# Patient Record
Sex: Female | Born: 1965 | Race: White | Hispanic: No | Smoking: Never smoker
Health system: Southern US, Community
[De-identification: ages and names within clinical notes are randomized; demographics above are authoritative.]

## PROBLEM LIST (undated history)

## (undated) DIAGNOSIS — S52502A Unspecified fracture of the lower end of left radius, initial encounter for closed fracture: Secondary | ICD-10-CM

## (undated) DIAGNOSIS — M941 Relapsing polychondritis: Secondary | ICD-10-CM

## (undated) DIAGNOSIS — R079 Chest pain, unspecified: Secondary | ICD-10-CM

## (undated) DIAGNOSIS — M8580 Other specified disorders of bone density and structure, unspecified site: Secondary | ICD-10-CM

## (undated) DIAGNOSIS — E669 Obesity, unspecified: Secondary | ICD-10-CM

## (undated) DIAGNOSIS — H04129 Dry eye syndrome of unspecified lacrimal gland: Secondary | ICD-10-CM

## (undated) DIAGNOSIS — K589 Irritable bowel syndrome without diarrhea: Secondary | ICD-10-CM

## (undated) DIAGNOSIS — F329 Major depressive disorder, single episode, unspecified: Secondary | ICD-10-CM

## (undated) DIAGNOSIS — F32A Depression, unspecified: Secondary | ICD-10-CM

## (undated) DIAGNOSIS — J301 Allergic rhinitis due to pollen: Secondary | ICD-10-CM

## (undated) HISTORY — DX: Depression, unspecified: F32.A

## (undated) HISTORY — DX: Dry eye syndrome of unspecified lacrimal gland: H04.129

## (undated) HISTORY — DX: Obesity, unspecified: E66.9

## (undated) HISTORY — DX: Other specified disorders of bone density and structure, unspecified site: M85.80

## (undated) HISTORY — DX: Chest pain, unspecified: R07.9

## (undated) HISTORY — DX: Irritable bowel syndrome, unspecified: K58.9

## (undated) HISTORY — DX: Allergic rhinitis due to pollen: J30.1

## (undated) HISTORY — DX: Major depressive disorder, single episode, unspecified: F32.9

---

## 2003-02-05 ENCOUNTER — Other Ambulatory Visit: Admission: RE | Admit: 2003-02-05 | Discharge: 2003-02-05 | Payer: Self-pay | Admitting: Family Medicine

## 2003-04-22 ENCOUNTER — Ambulatory Visit (HOSPITAL_COMMUNITY): Admission: RE | Admit: 2003-04-22 | Discharge: 2003-04-22 | Payer: Self-pay | Admitting: Family Medicine

## 2003-04-25 ENCOUNTER — Encounter (INDEPENDENT_AMBULATORY_CARE_PROVIDER_SITE_OTHER): Payer: Self-pay | Admitting: Specialist

## 2003-04-25 ENCOUNTER — Ambulatory Visit (HOSPITAL_COMMUNITY): Admission: RE | Admit: 2003-04-25 | Discharge: 2003-04-25 | Payer: Self-pay | Admitting: Gastroenterology

## 2003-04-28 ENCOUNTER — Emergency Department (HOSPITAL_COMMUNITY): Admission: EM | Admit: 2003-04-28 | Discharge: 2003-04-28 | Payer: Self-pay | Admitting: Emergency Medicine

## 2005-02-03 ENCOUNTER — Other Ambulatory Visit: Admission: RE | Admit: 2005-02-03 | Discharge: 2005-02-03 | Payer: Self-pay | Admitting: Family Medicine

## 2006-06-17 ENCOUNTER — Encounter: Admission: RE | Admit: 2006-06-17 | Discharge: 2006-06-17 | Payer: Self-pay | Admitting: Family Medicine

## 2006-07-19 ENCOUNTER — Ambulatory Visit: Payer: Self-pay | Admitting: Vascular Surgery

## 2006-08-04 ENCOUNTER — Encounter: Admission: RE | Admit: 2006-08-04 | Discharge: 2006-08-04 | Payer: Self-pay | Admitting: Orthopedic Surgery

## 2006-09-06 ENCOUNTER — Encounter: Admission: RE | Admit: 2006-09-06 | Discharge: 2006-10-06 | Payer: Self-pay | Admitting: Orthopedic Surgery

## 2006-10-24 ENCOUNTER — Other Ambulatory Visit: Admission: RE | Admit: 2006-10-24 | Discharge: 2006-10-24 | Payer: Self-pay | Admitting: Family Medicine

## 2007-04-26 ENCOUNTER — Encounter: Admission: RE | Admit: 2007-04-26 | Discharge: 2007-04-26 | Payer: Self-pay | Admitting: Internal Medicine

## 2009-03-18 ENCOUNTER — Emergency Department (HOSPITAL_COMMUNITY): Admission: EM | Admit: 2009-03-18 | Discharge: 2009-03-18 | Payer: Self-pay | Admitting: Emergency Medicine

## 2010-08-14 NOTE — Op Note (Signed)
NAME:  Fraiser, NOEL                           ACCOUNT NO.:  [id number]   MEDICAL RECORD NO.:  [id number]                   PATIENT TYPE:  AMB   LOCATION:  ENDO                                 FACILITY:  [facility]   PHYSICIAN:  Danise Edge, M.D.                DATE OF BIRTH:  [birth year]   DATE OF PROCEDURE:  04/25/2003  DATE OF DISCHARGE:                                 OPERATIVE REPORT   PROCEDURE:  Diagnostic colonoscopy.   PROCEDURE INDICATION:  Ms. Dan Dissinger is a 45 year old female, born Feb 10, 1966.  For approximately 1 year, Ms. Mehlhoff has had predominantly  morning-time functional-type, watery diarrhea occasionally associated with  the passage of fresh blood.  Usually by midmorning, her diarrhea has run its  course.  Recently, she has had bowel urgency with diarrhea extending into  the early afternoon.  She reports no nocturnal diarrhea, weight loss, or  fever.  Food intake does not seem to precipitate or worsen her diarrhea.  There is no family history of inflammatory bowel disease.  Approximately a  year ago, Ms.  Finan husband left her and her children abruptly.  This  is about the time her diarrhea started, and she thinks it is more stress  related than disease related.   MEDICATION ALLERGIES:  None.   CHRONIC MEDICATIONS:  Vicodin, Imodium, ibuprofen.   PAST MEDICAL/SURGICAL HISTORY:  Negative.   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  Her 33-year-old and 84-year-old daughter are in excellent health.   Ms. Stjames is undergoing diagnostic colonoscopy to evaluate diarrhea with  hematochezia.  Differential diagnosis would include inflammatory bowel  disease, microscopic colitis, and irritable bowel syndrome.   ENDOSCOPIST:  Charolett Bumpers, M.D.   PREMEDICATION:  1. Versed 10 mg.  2. Demerol 100 mg.   DESCRIPTION OF PROCEDURE:  After obtaining informed consent, Ms. Yinger  was placed in the left lateral  decubitus position.  I administered  intravenous Demerol and intravenous Versed to achieve conscious sedation for  the procedure.  The patient's blood pressure, oxygen saturation, and cardiac  rhythm were monitored throughout the procedure and documented in the medical  record.   Anal inspection was normal.  Digital rectal exam was normal.  The Olympus  adjustable pediatric colonoscope was introduced into the rectum and advanced  to the cecum.  A normal-appearing ileocecal valve was intubated and the  distal ileum inspected.  Colonic preparation for the exam today was  excellent.   RECTUM:  From the proximal rectum, a 1 mm sessile polyp was removed with the  cold biopsy forceps.  SIGMOID COLON AND DESCENDING COLON:  Normal.  SPLENIC FLEXURE:  Normal.  TRANSVERSE COLON:  Normal.  HEPATIC FLEXURE:  Normal.  ASCENDING COLON:  Normal.  CECUM AND ILEOCECAL VALVE:  Normal.  DISTAL ILEUM:  Normal.   ASSESSMENT:  Normal proctocolonoscopy to the cecum  with inspection of the  distal ileum except for the removal of a diminutive polyp from the proximal  rectum which is probably hyperplastic.  Random colonic biopsies were taken  from the right colon and left colon, looking for signs of microscopic -  collagenous colitis.                                               Danise Edge, M.D.    MJ/MEDQ  D:  04/25/2003  T:  04/25/2003  Job:  [id number]   cc:   L. Lupe Carney, M.D.  [address] [place]  [place] [address]  Fax: [phone]

## 2011-01-08 ENCOUNTER — Other Ambulatory Visit (HOSPITAL_COMMUNITY): Admission: RE | Admit: 2011-01-08 | Payer: Medicaid Other | Source: Ambulatory Visit | Admitting: Family Medicine

## 2011-01-08 ENCOUNTER — Other Ambulatory Visit: Payer: Self-pay | Admitting: Physician Assistant

## 2011-01-08 DIAGNOSIS — Z01419 Encounter for gynecological examination (general) (routine) without abnormal findings: Secondary | ICD-10-CM | POA: Insufficient documentation

## 2011-01-11 ENCOUNTER — Other Ambulatory Visit: Payer: Self-pay | Admitting: Physician Assistant

## 2013-01-09 ENCOUNTER — Encounter (HOSPITAL_COMMUNITY): Payer: Self-pay | Admitting: Emergency Medicine

## 2013-01-09 ENCOUNTER — Emergency Department (HOSPITAL_COMMUNITY)
Admission: EM | Admit: 2013-01-09 | Discharge: 2013-01-09 | Disposition: A | Discharge status: Home / Self Care | Payer: Medicaid Other | Attending: Emergency Medicine | Admitting: Emergency Medicine

## 2013-01-09 ENCOUNTER — Emergency Department (HOSPITAL_COMMUNITY): Payer: Medicaid Other

## 2013-01-09 DIAGNOSIS — Z87312 Personal history of (healed) stress fracture: Secondary | ICD-10-CM | POA: Insufficient documentation

## 2013-01-09 DIAGNOSIS — Y939 Activity, unspecified: Secondary | ICD-10-CM | POA: Insufficient documentation

## 2013-01-09 DIAGNOSIS — S8253XA Displaced fracture of medial malleolus of unspecified tibia, initial encounter for closed fracture: Secondary | ICD-10-CM | POA: Insufficient documentation

## 2013-01-09 DIAGNOSIS — R209 Unspecified disturbances of skin sensation: Secondary | ICD-10-CM | POA: Insufficient documentation

## 2013-01-09 DIAGNOSIS — M948X9 Other specified disorders of cartilage, unspecified sites: Secondary | ICD-10-CM | POA: Insufficient documentation

## 2013-01-09 DIAGNOSIS — Y929 Unspecified place or not applicable: Secondary | ICD-10-CM | POA: Insufficient documentation

## 2013-01-09 DIAGNOSIS — X58XXXA Exposure to other specified factors, initial encounter: Secondary | ICD-10-CM | POA: Insufficient documentation

## 2013-01-09 DIAGNOSIS — S82891A Other fracture of right lower leg, initial encounter for closed fracture: Secondary | ICD-10-CM

## 2013-01-09 HISTORY — DX: Relapsing polychondritis: M94.1

## 2013-01-09 MED ORDER — HYDROCODONE-ACETAMINOPHEN 5-325 MG PO TABS: 1.0000 | ORAL_TABLET | Freq: Four times a day (QID) | ORAL | Status: DC | PRN

## 2013-01-09 NOTE — ED Notes (Signed)
Pt reports that right ankle pain that started a couple of days ago. Reports that she has a hx of stress fractures. Pt with a swollen ankle at triage. 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

## 2013-01-09 NOTE — ED Provider Notes (Signed)
CSN: [id number]     Arrival date & time 01/09/13  1130 History   First MD Initiated Contact with Patient 01/09/13 1142     Chief Complaint  Patient presents with  . Ankle Pain   (Consider location/radiation/quality/duration/timing/severity/associated sxs/prior Treatment) HPI Patient presents to ED with right ankle pain for 4 days. She has a history of relapsing polychondritis, and multiple stress fractures. She reports that this pain is similar to past stress fractures. The pain began 4 days ago, and has worsened until today when the pain was 10/10 in severity. It is sharp, worsening with movement and bearing weight. It radiates up the leg from the ankle into the calf, and keeps her up at night. The pain is more severe medially than laterally, but she reports some numbness in the lateral foot and toes. Motrin, ice, compression, and elevation provided no relief, and one hydrocodone provided little relief. She denies fever.   Past Medical History  Diagnosis Date  . Relapsing polychondritis    History reviewed. No pertinent past surgical history. History reviewed. No pertinent family history. History  Substance Use Topics  . Smoking status: Never Smoker   . Smokeless tobacco: Not on file  . Alcohol Use: No   OB History   Grav Para Term Preterm Abortions TAB SAB Ect Mult Living                 Review of Systems All other systems negative except as documented in the HPI. All pertinent positives and negatives as reviewed in the HPI.  Allergies  Review of patient's allergies indicates no known allergies.  Home Medications   Current Outpatient Rx  Name  Route  Sig  Dispense  Refill  . HYDROcodone-acetaminophen (NORCO/VICODIN) 5-325 MG per tablet   Oral   Take 1 tablet by mouth every 6 (six) hours as needed for pain.          BP 144/74  Pulse 90  Temp(Src) 97.8 F (36.6 C) (Oral)  Resp 18  SpO2 100%  LMP 12/19/2012 Physical Exam  Constitutional: She is oriented to person,  place, and time. She appears well-developed and well-nourished. No distress.  Musculoskeletal:       Right ankle: She exhibits decreased range of motion and swelling. She exhibits no ecchymosis and no deformity. Tenderness. Lateral malleolus, medial malleolus and AITFL tenderness found. No CF ligament, no posterior TFL, no head of 5th metatarsal and no proximal fibula tenderness found. Achilles tendon normal.  Decreased ROM of right ankle.  Neurological: She is alert and oriented to person, place, and time. No sensory deficit.  No sensory deficit in right ankle, but has decreased strength throughout ROM of ankle.  Skin: Skin is warm and dry. She is not diaphoretic. No erythema.    ED Course  Procedures (including critical care time) Patient be placed in a Cam Walker and referred to orthopedics.  Patient voices an understanding of the plan.  She has a history of this and understands the expected course    Carlyle Dolly, PA-C 01/10/13 1559

## 2013-01-09 NOTE — ED Notes (Signed)
Pt has her own cam walker.

## 2013-01-10 NOTE — ED Provider Notes (Signed)
Medical screening examination/treatment/procedure(s) were performed by non-physician practitioner and as supervising physician I was immediately available for consultation/collaboration.   Laray Anger, DO 01/10/13 2204

## 2013-02-15 ENCOUNTER — Emergency Department (HOSPITAL_COMMUNITY): Payer: Self-pay

## 2013-02-15 ENCOUNTER — Encounter (HOSPITAL_COMMUNITY): Payer: Self-pay | Admitting: Emergency Medicine

## 2013-02-15 ENCOUNTER — Emergency Department (HOSPITAL_COMMUNITY)
Admission: EM | Admit: 2013-02-15 | Discharge: 2013-02-15 | Disposition: A | Discharge status: Home / Self Care | Payer: Self-pay | Attending: Emergency Medicine | Admitting: Emergency Medicine

## 2013-02-15 DIAGNOSIS — R079 Chest pain, unspecified: Secondary | ICD-10-CM

## 2013-02-15 DIAGNOSIS — M25579 Pain in unspecified ankle and joints of unspecified foot: Secondary | ICD-10-CM | POA: Insufficient documentation

## 2013-02-15 DIAGNOSIS — R071 Chest pain on breathing: Secondary | ICD-10-CM | POA: Insufficient documentation

## 2013-02-15 DIAGNOSIS — G8911 Acute pain due to trauma: Secondary | ICD-10-CM | POA: Insufficient documentation

## 2013-02-15 DIAGNOSIS — R05 Cough: Secondary | ICD-10-CM | POA: Insufficient documentation

## 2013-02-15 DIAGNOSIS — R059 Cough, unspecified: Secondary | ICD-10-CM | POA: Insufficient documentation

## 2013-02-15 DIAGNOSIS — R0602 Shortness of breath: Secondary | ICD-10-CM | POA: Insufficient documentation

## 2013-02-15 LAB — POCT I-STAT TROPONIN I: Troponin i, poc: 0 ng/mL (ref 0.00–0.08)

## 2013-02-15 LAB — BASIC METABOLIC PANEL
BUN: 10 mg/dL (ref 6–23)
CO2: 25 mEq/L (ref 19–32)
Chloride: 101 mEq/L (ref 96–112)
Creatinine, Ser: 0.68 mg/dL (ref 0.50–1.10)
GFR calc Af Amer: >90 mL/min (ref >90)
Potassium: 3.5 mEq/L (ref 3.5–5.1)

## 2013-02-15 LAB — CBC
HCT: 33.6 % — ABNORMAL LOW (ref 36.0–46.0)
Hemoglobin: 11.2 g/dL — ABNORMAL LOW (ref 12.0–15.0)
MCHC: 33.3 g/dL (ref 30.0–36.0)
MCV: 82.2 fL (ref 78.0–100.0)
RDW: 13.6 % (ref 11.5–15.5)
WBC: 8.4 10*3/uL (ref 4.0–10.5)

## 2013-02-15 MED ORDER — ONDANSETRON 4 MG PO TBDP
8.0000 mg | ORAL_TABLET | Freq: Once | ORAL | Status: AC
  Administered 2013-02-15: 8 mg via ORAL
  Filled 2013-02-15: qty 2

## 2013-02-15 MED ORDER — PREDNISONE 20 MG PO TABS: 40.0000 mg | ORAL_TABLET | Freq: Every day | ORAL | Status: DC

## 2013-02-15 MED ORDER — ONDANSETRON HCL 4 MG/2ML IJ SOLN
4.0000 mg | Freq: Once | INTRAMUSCULAR | Status: AC
  Administered 2013-02-15: 4 mg via INTRAVENOUS
  Filled 2013-02-15: qty 2

## 2013-02-15 MED ORDER — OXYCODONE-ACETAMINOPHEN 5-325 MG PO TABS: 2.0000 | ORAL_TABLET | ORAL | Status: DC | PRN

## 2013-02-15 MED ORDER — MORPHINE SULFATE 4 MG/ML IJ SOLN
4.0000 mg | Freq: Once | INTRAMUSCULAR | Status: AC
  Administered 2013-02-15: 4 mg via INTRAVENOUS
  Filled 2013-02-15: qty 1

## 2013-02-15 MED ORDER — HYDROMORPHONE HCL PF 1 MG/ML IJ SOLN
1.0000 mg | Freq: Once | INTRAMUSCULAR | Status: AC
  Administered 2013-02-15: 1 mg via INTRAVENOUS
  Filled 2013-02-15: qty 1

## 2013-02-15 MED ORDER — ALBUTEROL SULFATE (5 MG/ML) 0.5% IN NEBU
2.5000 mg | INHALATION_SOLUTION | Freq: Once | RESPIRATORY_TRACT | Status: AC
  Administered 2013-02-15: 2.5 mg via RESPIRATORY_TRACT
  Filled 2013-02-15: qty 0.5

## 2013-02-15 MED ORDER — PROMETHAZINE HCL 25 MG PO TABS: 25.0000 mg | ORAL_TABLET | Freq: Four times a day (QID) | ORAL | Status: DC | PRN

## 2013-02-15 MED ORDER — IPRATROPIUM BROMIDE 0.02 % IN SOLN
0.5000 mg | Freq: Once | RESPIRATORY_TRACT | Status: AC
  Administered 2013-02-15: 0.5 mg via RESPIRATORY_TRACT
  Filled 2013-02-15: qty 2.5

## 2013-02-15 MED ORDER — HYDROCODONE-HOMATROPINE 5-1.5 MG/5ML PO SYRP: 5.0000 mL | ORAL_SOLUTION | Freq: Four times a day (QID) | ORAL | Status: DC | PRN

## 2013-02-15 MED ORDER — IOHEXOL 350 MG/ML SOLN
80.0000 mL | Freq: Once | INTRAVENOUS | Status: AC | PRN
  Administered 2013-02-15: 80 mL via INTRAVENOUS

## 2013-02-15 NOTE — ED Notes (Signed)
Pt reports hx of relapsing polychrondiritis. Having sob x 24 hours. ekg done at triage. Airway intact.

## 2013-02-15 NOTE — ED Provider Notes (Signed)
CSN: [id number]     Arrival date & time 02/15/13  1135 History   First MD Initiated Contact with Patient 02/15/13 1149     Chief Complaint  Patient presents with  . Shortness of Breath   (Consider location/radiation/quality/duration/timing/severity/associated sxs/prior Treatment) HPI Comments: Patient is a 47 year old female with a past medical history of relapsing polychondritis who presents with a 1 week history of SOB. Symptoms started gradually and progressively worsened since the onset. The symptoms acutely worsened in the past 2 days. Patient reports associated dry cough and right sides chest pain. The pain is sharp and pleuritic. Patient has been wearing a CAM walker on her right leg due to an ankle fracture for the past 5 weeks. Patient states this feels exactly like her polychondritis flares. No aggravating/alleviating factors. No associated symptoms. Patient explains she has been unable to see her regular doctor since she lost her job and no longer has her insurance.    Past Medical History  Diagnosis Date  . Relapsing polychondritis    History reviewed. No pertinent past surgical history. History reviewed. No pertinent family history. History  Substance Use Topics  . Smoking status: Never Smoker   . Smokeless tobacco: Not on file  . Alcohol Use: No   OB History   Grav Para Term Preterm Abortions TAB SAB Ect Mult Living                 Review of Systems  Respiratory: Positive for cough and shortness of breath.   Cardiovascular: Positive for chest pain.  All other systems reviewed and are negative.    Allergies  Review of patient's allergies indicates no known allergies.  Home Medications   Current Outpatient Rx  Name  Route  Sig  Dispense  Refill  . HYDROcodone-acetaminophen (NORCO/VICODIN) 5-325 MG per tablet   Oral   Take 1 tablet by mouth every 6 (six) hours as needed for pain.         Marland Kitchen HYDROcodone-acetaminophen (NORCO/VICODIN) 5-325 MG per tablet  Oral   Take 1 tablet by mouth every 6 (six) hours as needed for pain.   15 tablet   0    BP 143/74  Pulse 113  Temp(Src) 97.9 F (36.6 C) (Oral)  Resp 18  SpO2 100% Physical Exam  Nursing note and vitals reviewed. Constitutional: She is oriented to person, place, and time. She appears well-developed and well-nourished. No distress.  Patient coughing throughout interview and exam.   HENT:  Head: Normocephalic and atraumatic.  Eyes: Conjunctivae and EOM are normal.  Neck: Normal range of motion.  Cardiovascular: Regular rhythm.  Exam reveals no gallop and no friction rub.   No murmur heard. tachycardic  Pulmonary/Chest: Effort normal and breath sounds normal. She has no wheezes. She has no rales. She exhibits tenderness.  Central chest tenderness to palpation.   Abdominal: Soft. She exhibits no distension. There is no tenderness. There is no rebound and no guarding.  Musculoskeletal: Normal range of motion.  No calf or popliteal tenderness to palpation. No lower leg edema. ACE bandages on bilateral ankles. CAM walker on right lower leg.   Neurological: She is alert and oriented to person, place, and time. Coordination normal.  Speech is goal-oriented. Moves limbs without ataxia.   Skin: Skin is warm and dry.  Psychiatric: She has a normal mood and affect. Her behavior is normal.    ED Course  Procedures (including critical care time) Labs Review Labs Reviewed  BASIC METABOLIC PANEL -  Abnormal; Notable for the following:    Glucose, Bld 106 (*)    All other components within normal limits  CBC - Abnormal; Notable for the following:    Hemoglobin 11.2 (*)    HCT 33.6 (*)    All other components within normal limits  D-DIMER, QUANTITATIVE - Abnormal; Notable for the following:    D-Dimer, Quant 2.62 (*)    All other components within normal limits  PRO B NATRIURETIC PEPTIDE  POCT I-STAT TROPONIN I   Imaging Review Dg Chest 2 View  02/15/2013   CLINICAL DATA:   Shortness of breath.  Cough.  EXAM: CHEST  2 VIEW  COMPARISON:  None.  FINDINGS: Mediastinum and hilar structures are unremarkable. Mild cardiomegaly and pulmonary venous prominence noted. Mild atelectasis versus infiltrate right lung base. Followup PA and lateral chest x-ray suggested for continued evaluation. No acute bony abnormality identified. Degenerative changes thoracic spine.  IMPRESSION: 1. Mild infiltrate right lower lobe cannot be excluded. 2. Cardiomegaly with mild pulmonary venous congestion. Followup chest x-ray may prove helpful for continued evaluation.   Electronically Signed   By: Maisie Fus  Register   On: 02/15/2013 13:06    EKG Interpretation    Date/Time:  Thursday February 15 2013 11:39:35 EST Ventricular Rate:  124 PR Interval:  138 QRS Duration: 76 QT Interval:  320 QTC Calculation: 459 R Axis:   74 Text Interpretation:  Sinus tachycardia Otherwise normal ECG No old tracing to compare Confirmed by GOLDSTON  MD, SCOTT ([id number]) on 02/15/2013 11:57:58 AM            MDM   1. Chest pain   2. SOB (shortness of breath)   3. Ankle pain, unspecified laterality     11:49 AM Labs, chest xray and EKG pending. Patient is tachycardic with remaining vitals stable.   4:13 PM Labs unremarkable. Chest xray shows right lower infiltrate. Patient's d-dimer is 2.62. Patient will have CT chest to rule out PE. Patient continues to be tachycardic and SOB. Patient is also requesting bilateral ankle films due to worsening ankle pain. She has no PCP so I agreed to order them.   7:32 PM D-dimer was elevate warranting a chest CT angio which was negative for PE. Patient will have pain medication and instructions to follow up with a PCP from the resource guide. Patient will speak with case management for prescription assistance.   7:49 PM Case manager states they provide no assistance with narcatics and prednisone is inexpensive. Patient will be discharged with instructions to return with  worsening or concerning symptoms.   Emilia Beck, PA-C 02/15/13 2040

## 2013-02-15 NOTE — ED Notes (Signed)
Patient returned from X-ray 

## 2013-02-15 NOTE — Progress Notes (Signed)
ED CM received call from [facility] PA-C.in regards to medication assistance. Pt is unemployed and unisured.  Pt encouraged to use [facility] $4 drug list. Discussed the MATCH criteria with K. Szekalski PA-C. Plan Discharge patient with prescriptions to fill from $4 list from [facility]. No further CM needs identified.

## 2013-02-15 NOTE — ED Notes (Signed)
Patient transported to X-ray 

## 2013-02-18 NOTE — ED Provider Notes (Signed)
Medical screening examination/treatment/procedure(s) were performed by non-physician practitioner and as supervising physician I was immediately available for consultation/collaboration.  EKG Interpretation    Date/Time:  Thursday February 15 2013 11:39:35 EST Ventricular Rate:  124 PR Interval:  138 QRS Duration: 76 QT Interval:  320 QTC Calculation: 459 R Axis:   74 Text Interpretation:  Sinus tachycardia Otherwise normal ECG No old tracing to compare Confirmed by Latiesha Harada  MD, Oluwatobiloba Martin ([id number]) on 02/15/2013 11:57:58 AM              Audree Camel, MD 02/18/13 1511

## 2013-03-03 ENCOUNTER — Encounter (HOSPITAL_COMMUNITY): Payer: Self-pay | Admitting: Emergency Medicine

## 2013-03-03 ENCOUNTER — Emergency Department (HOSPITAL_COMMUNITY): Payer: Medicaid Other

## 2013-03-03 ENCOUNTER — Emergency Department (HOSPITAL_COMMUNITY)
Admission: EM | Admit: 2013-03-03 | Discharge: 2013-03-03 | Disposition: A | Discharge status: Home / Self Care | Payer: Medicaid Other | Attending: Emergency Medicine | Admitting: Emergency Medicine

## 2013-03-03 DIAGNOSIS — Z79899 Other long term (current) drug therapy: Secondary | ICD-10-CM | POA: Insufficient documentation

## 2013-03-03 DIAGNOSIS — M25572 Pain in left ankle and joints of left foot: Secondary | ICD-10-CM

## 2013-03-03 DIAGNOSIS — R0602 Shortness of breath: Secondary | ICD-10-CM | POA: Insufficient documentation

## 2013-03-03 DIAGNOSIS — G8929 Other chronic pain: Secondary | ICD-10-CM | POA: Insufficient documentation

## 2013-03-03 DIAGNOSIS — M25476 Effusion, unspecified foot: Secondary | ICD-10-CM | POA: Insufficient documentation

## 2013-03-03 DIAGNOSIS — J40 Bronchitis, not specified as acute or chronic: Secondary | ICD-10-CM

## 2013-03-03 DIAGNOSIS — M25473 Effusion, unspecified ankle: Secondary | ICD-10-CM | POA: Insufficient documentation

## 2013-03-03 DIAGNOSIS — M25579 Pain in unspecified ankle and joints of unspecified foot: Secondary | ICD-10-CM | POA: Insufficient documentation

## 2013-03-03 DIAGNOSIS — J209 Acute bronchitis, unspecified: Secondary | ICD-10-CM | POA: Insufficient documentation

## 2013-03-03 DIAGNOSIS — M948X9 Other specified disorders of cartilage, unspecified sites: Secondary | ICD-10-CM | POA: Insufficient documentation

## 2013-03-03 MED ORDER — PREDNISONE 10 MG PO TABS: 40.0000 mg | ORAL_TABLET | Freq: Every day | ORAL | Status: DC

## 2013-03-03 MED ORDER — HYDROCODONE-ACETAMINOPHEN 5-325 MG PO TABS: 1.0000 | ORAL_TABLET | Freq: Four times a day (QID) | ORAL | Status: DC | PRN

## 2013-03-03 NOTE — ED Provider Notes (Signed)
CSN: [id number]     Arrival date & time 03/03/13  1045 History   First MD Initiated Contact with Patient 03/03/13 1111     Chief Complaint  Patient presents with  . Foot Pain  . Shortness of Breath   (Consider location/radiation/quality/duration/timing/severity/associated sxs/prior Treatment) Patient is a 47 y.o. female presenting with lower extremity pain and shortness of breath.  Foot Pain Associated symptoms include shortness of breath. Pertinent negatives include no chest pain and no abdominal pain.  Shortness of Breath Associated symptoms: cough   Associated symptoms: no abdominal pain, no chest pain, no fever and no rash    patient with a problem of chronic pain in her right foot in the past but ended up to be a questionable stress fracture. Patient now with left foot pain and ankle pain for the past several days. Patient also seen recently for bronchitis and a picture and was on course of prednisone stopped a week ago that seemed to help and now she's starting to feel short of breath again and feeling as if she got inflammation her lungs and difficulty deep breath with a mild cough.  Patient denies any significant injury to the left foot or ankle. Discomfort in that area is 8/10 made worse by walking.  Past Medical History  Diagnosis Date  . Relapsing polychondritis    History reviewed. No pertinent past surgical history. History reviewed. No pertinent family history. History  Substance Use Topics  . Smoking status: Never Smoker   . Smokeless tobacco: Not on file  . Alcohol Use: No   OB History   Grav Para Term Preterm Abortions TAB SAB Ect Mult Living                 Review of Systems  Constitutional: Negative for fever.  HENT: Negative for congestion.   Eyes: Negative for redness.  Respiratory: Positive for cough and shortness of breath.   Cardiovascular: Negative for chest pain.  Gastrointestinal: Negative for abdominal pain.  Genitourinary: Negative for dysuria.   Musculoskeletal: Positive for joint swelling.  Skin: Negative for rash.  Hematological: Does not bruise/bleed easily.  Psychiatric/Behavioral: Negative for confusion.    Allergies  Review of patient's allergies indicates no known allergies.  Home Medications   Current Outpatient Rx  Name  Route  Sig  Dispense  Refill  . CALCIUM-VITAMIN D PO   Oral   Take 1 tablet by mouth daily.         Marland Kitchen guaiFENesin (ROBITUSSIN) 100 MG/5ML liquid   Oral   Take 600 mg by mouth 3 (three) times daily as needed for cough.         Marland Kitchen HYDROcodone-homatropine (HYCODAN) 5-1.5 MG/5ML syrup   Oral   Take 5 mLs by mouth every 6 (six) hours as needed for cough.   120 mL   0   . ibuprofen (ADVIL,MOTRIN) 200 MG tablet   Oral   Take 600 mg by mouth every 6 (six) hours as needed for moderate pain.         . naphazoline-glycerin (CLEAR EYES) 0.012-0.2 % SOLN   Both Eyes   Place 2 drops into both eyes every 4 (four) hours as needed for irritation.         Marland Kitchen oxyCODONE-acetaminophen (PERCOCET/ROXICET) 5-325 MG per tablet   Oral   Take 2 tablets by mouth every 4 (four) hours as needed for severe pain.   30 tablet   0   . promethazine (PHENERGAN) 25 MG tablet  Oral   Take 1 tablet (25 mg total) by mouth every 6 (six) hours as needed for nausea or vomiting.   12 tablet   0   . HYDROcodone-acetaminophen (NORCO/VICODIN) 5-325 MG per tablet   Oral   Take 1-2 tablets by mouth every 6 (six) hours as needed for moderate pain.   20 tablet   0   . predniSONE (DELTASONE) 10 MG tablet   Oral   Take 4 tablets (40 mg total) by mouth daily.   20 tablet   0    BP 134/67  Pulse 87  Temp(Src) 98.3 F (36.8 C)  Resp 14  Ht 5\' 10"  (1.778 m)  Wt 220 lb (99.791 kg)  BMI 31.57 kg/m2  SpO2 98% Physical Exam  Nursing note and vitals reviewed. Constitutional: She is oriented to person, place, and time. She appears well-developed and well-nourished. No distress.  HENT:  Head: Normocephalic and  atraumatic.  Eyes: Conjunctivae and EOM are normal.  Neck: Normal range of motion.  Cardiovascular: Normal rate and regular rhythm.   No murmur heard. Pulmonary/Chest: Effort normal and breath sounds normal. No respiratory distress. She has no wheezes. She has no rales.  Abdominal: Soft. Bowel sounds are normal. There is no tenderness.  Musculoskeletal: Normal range of motion. She exhibits edema and tenderness.  Patient was swelling to both ankles. Left greater than right mild tenderness to palpation also some tenderness over the left forefoot. Cap refill in both feet is 1 second. Good range of motion of toes. No proximal fibular tenderness.  Neurological: She is alert and oriented to person, place, and time. No cranial nerve deficit. She exhibits normal muscle tone. Coordination normal.  Skin: Skin is warm. No rash noted.    ED Course  Procedures (including critical care time) Labs Review Labs Reviewed - No data to display Imaging Review Dg Chest 2 View  03/03/2013   CLINICAL DATA:  Shortness of breath.  EXAM: CHEST  2 VIEW  COMPARISON:  Chest x-ray dated 02/15/2013  FINDINGS: Heart size and pulmonary vascularity are normal. Lungs are clear. No osseous abnormality.  IMPRESSION: Normal chest.   Electronically Signed   By: Geanie Cooley M.D.   On: 03/03/2013 13:17   Dg Ankle Complete Left  03/03/2013   CLINICAL DATA:  Foot pain, relapsing polychondritis  EXAM: LEFT ANKLE COMPLETE - 3+ VIEW  COMPARISON:  02/15/2013  FINDINGS: Three views of left ankle submitted. No acute fracture or subluxation. Diffuse mild soft tissue swelling. Ankle mortise is preserved. Plantar and posterior spurring of calcaneus.  IMPRESSION: No acute fracture or subluxation. Diffuse soft tissue swelling. Plantar and posterior spurring of calcaneus.   Electronically Signed   By: Natasha Mead M.D.   On: 03/03/2013 13:12   Dg Foot Complete Left  03/03/2013   CLINICAL DATA:  Foot pain, polychondritis  EXAM: LEFT FOOT - COMPLETE  3+ VIEW  COMPARISON:  None.  FINDINGS: Three views of the left foot submitted. No acute fracture or subluxation. There is plantar and posterior spurring of calcaneus.  IMPRESSION: No acute fracture or subluxation.  Spurring of calcaneus.   Electronically Signed   By: Natasha Mead M.D.   On: 03/03/2013 13:12    EKG Interpretation   None       MDM   1. Bronchitis   2. Ankle pain, left    Workup for the 2 complaints without significant findings. Chest x-rays negative for pneumonia. Will treat as if it could be a bronchitis. Patient's been  helped by prednisone in the past we'll give another short course of prednisone have her followup with primary care Dr. Montez Morita of the left foot and ankle without any bony injuries. Patient has orthopedic boot at home that she can wear for that. Recommend followup with podiatry.    Shelda Jakes, MD 03/03/13 [phone]

## 2013-03-03 NOTE — ED Notes (Signed)
Per pt she has a condition where her cartilage breaks down and she believes she has injury to left foot. sts previous injury to right foot about 1 month ago. sts also she thinks she has some inflammation in lungs because it is hard for her to take a deep breath

## 2013-10-18 ENCOUNTER — Other Ambulatory Visit (HOSPITAL_COMMUNITY): Payer: Self-pay | Admitting: Physician Assistant

## 2013-10-18 DIAGNOSIS — Z1231 Encounter for screening mammogram for malignant neoplasm of breast: Secondary | ICD-10-CM

## 2013-11-06 ENCOUNTER — Ambulatory Visit (HOSPITAL_COMMUNITY)
Admission: RE | Admit: 2013-11-06 | Discharge: 2013-11-06 | Disposition: A | Discharge status: Home / Self Care | Payer: Medicaid Other | Source: Ambulatory Visit | Attending: Physician Assistant | Admitting: Physician Assistant

## 2013-11-06 DIAGNOSIS — Z1231 Encounter for screening mammogram for malignant neoplasm of breast: Secondary | ICD-10-CM

## 2013-11-07 ENCOUNTER — Other Ambulatory Visit: Payer: Self-pay | Admitting: Physician Assistant

## 2013-11-07 DIAGNOSIS — R928 Other abnormal and inconclusive findings on diagnostic imaging of breast: Secondary | ICD-10-CM

## 2013-11-15 ENCOUNTER — Ambulatory Visit
Admission: RE | Admit: 2013-11-15 | Discharge: 2013-11-15 | Disposition: A | Discharge status: Home / Self Care | Payer: Medicaid Other | Source: Ambulatory Visit | Attending: Physician Assistant | Admitting: Physician Assistant

## 2013-11-15 DIAGNOSIS — R928 Other abnormal and inconclusive findings on diagnostic imaging of breast: Secondary | ICD-10-CM

## 2014-05-17 ENCOUNTER — Encounter: Payer: Self-pay | Admitting: Cardiovascular Disease

## 2014-05-17 ENCOUNTER — Ambulatory Visit (INDEPENDENT_AMBULATORY_CARE_PROVIDER_SITE_OTHER): Payer: Medicaid Other | Admitting: Cardiovascular Disease

## 2014-05-17 VITALS — BP 128/78 | HR 76 | Ht 70.0 in | Wt 216.8 lb

## 2014-05-17 DIAGNOSIS — R079 Chest pain, unspecified: Secondary | ICD-10-CM

## 2014-05-17 NOTE — Progress Notes (Signed)
Cardiology Office Note   Date:  05/17/2014   ID:  Traci Higgins, DOB 05/07/1965, MRN [id number]  PCP:  REDMON,NOELLE, PA-C  Cardiologist:   Elease Hashimoto Deloris Ping, MD   Chief Complaint  Patient presents with  . Chest Pain   Problem List  1. Chest pain : 2. Relapsing Polychrondritis     History of Present Illness: Traci Higgins is a 49 y.o. female who presents for evaluation of chest pain. She has had problems with relapsing polychondritis.  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   Has a bruised feeling in her chest - for the past 4-5 months. Does not go away.  Not worsened with exertion, not related to eating.  Occasionally has palpitation but the palpitations are not related , no pleuretic   She walks 3-4 times a week with out any worsening of her CP or dyspnea.     Past Medical History  Diagnosis Date  . Relapsing polychondritis   . Depression   . Osteopenia   . Dry eye   . Chest pain   . Obesity   . Allergic rhinitis due to pollen   . IBS (irritable bowel syndrome)     No past surgical history on file.   Current Outpatient Prescriptions  Medication Sig Dispense Refill  . Adalimumab 40 MG/0.8ML PSKT Inject into the skin.    . naproxen sodium (ANAPROX) 550 MG tablet Take 500 mg by mouth 2 (two) times daily with a meal.     . sertraline (ZOLOFT) 50 MG tablet Take 50 mg by mouth daily.     No current facility-administered medications for this visit.    Allergies:   Review of patient's allergies indicates no known allergies.    Social History:  The patient  reports that she has never smoked. She does not have any smokeless tobacco history on file. She reports that she does not drink alcohol or use illicit drugs.   Family History:  The patient's family history includes Cancer in her maternal grandfather; Heart attack in her maternal grandmother and mother; Hypertension in  her mother; Thyroid disease in her mother.    ROS:  Please see the history of present illness.    Review of Systems: Constitutional:  denies fever, chills, diaphoresis, appetite change and fatigue.  HEENT: denies photophobia, eye pain, redness, hearing loss, ear pain, congestion, sore throat, rhinorrhea, sneezing, neck pain, neck stiffness and tinnitus.  Respiratory: denies SOB, DOE, cough, chest tightness, and wheezing.  Cardiovascular: denies chest pain, palpitations and leg swelling.  Gastrointestinal: denies nausea, vomiting, abdominal pain, diarrhea, constipation, blood in stool.  Genitourinary: denies dysuria, urgency, frequency, hematuria, flank pain and difficulty urinating.  Musculoskeletal: admits to  myalgias, back pain, joint swelling, arthralgias and gait problem.   Skin: denies pallor, rash and wound.  Neurological: denies dizziness, seizures, syncope, weakness, light-headedness, numbness and headaches.   Hematological: denies adenopathy, easy bruising, personal or family bleeding history.  Psychiatric/ Behavioral: denies suicidal ideation, mood changes, confusion, nervousness, sleep disturbance and agitation.       All other systems are reviewed and negative.    PHYSICAL EXAM: VS:  BP 128/78 mmHg  Pulse 76  Ht  (1.778 m)  Wt 216 lb 12.8 oz (98.34 kg)  BMI 31.11 kg/m2  LMP 12/19/2012 , BMI Body mass index is 31.11 kg/(m^2). GEN: Well nourished, well developed, in no acute distress HEENT:  normal Neck: no JVD, carotid bruits, or masses Cardiac: RRR; no murmurs, rubs, or gallops,no edema ,  Chest wall tenderness.  Respiratory:  clear to auscultation bilaterally, normal work of breathing GI: soft, nontender, nondistended, + BS MS: no deformity or atrophy Skin: warm and dry, no rash Neuro:  Strength and sensation are intact Psych: normal   EKG:  EKG is ordered today. The ekg ordered today demonstrates Feb. 19, 2016  . NSR at 76. Normal ecg    Recent  Labs: No results found for requested labs within last 365 days.    Lipid Panel No results found for: CHOL, TRIG, HDL, CHOLHDL, VLDL, LDLCALC, LDLDIRECT    Wt Readings from Last 3 Encounters:  05/17/14 216 lb 12.8 oz (98.34 kg)  03/03/13 220 lb (99.791 kg)      Other studies Reviewed: Additional studies/ records that were reviewed today include: . Review of the above records demonstrates:    ASSESSMENT AND PLAN:  1.  Chest pain  - likely noncardiac .  She has some chest wall tenderness.  I have reviewed the up-to-date data. Relapsing polychondritis is associated with regurgitation of the mitral valve and aortic valve. She will need periodic echocardiograms  2. Relapsing Polychrondritis - she has had a great response to Humira .    From Up to Date: Clinically significant aortic or mitral valvular disease occurs in approximately 10 percent of patients. Less frequent cardiac manifestations of RPC include pericarditis, heart block, and myocardial infarction due to coronary arteritis.  A review of 440 patients with RPC found the following prevalence of valvular disease.    Isolated mitral valve regurgitation: 1.8 percent Isolated aortic insufficiency Combined valvular regurgitation: 1.6 percent Aortic regurgitation can result from the destruction of valvular cusps, aortic ring dilatation, or dilatation or thickening of the aortic root.  Clinical manifestations and diagnosis of chronic aortic regurgitation in adults" Valvular disease may appear within months of the onset of other RPC symptoms or may be delayed for more than a decade  Progression of the disease is often insidious; as a result, echocardiography should be performed periodically to assess for valvular thickening or regurgitation   The clinician must be alert to changes in the intensity or character of an existing murmur or to the development of a new murmur.    Current medicines are reviewed at length with the patient  today.  The patient does not have concerns regarding medicines.  The following changes have been made:  no change   Disposition:   FU with me  In 1 year.  I will anticipate getting echos on a regular basis.     Signed, Nahser, Deloris PingPhilip J, MD  05/17/2014 11:43 AM    [facility] [address], [place], [place]  [address] Phone: [phone]; Fax: [phone]

## 2014-05-17 NOTE — Patient Instructions (Signed)
Your physician recommends that you continue on your current medications as directed. Please refer to the Current Medication list given to you today.  Your physician has requested that you have an echocardiogram. Echocardiography is a painless test that uses sound waves to create images of your heart. It provides your doctor with information about the size and shape of your heart and how well your heart's chambers and valves are working. This procedure takes approximately one hour. There are no restrictions for this procedure.  Your physician wants you to follow-up in: 1 year with Dr. Nahser.  You will receive a reminder letter in the mail two months in advance. If you don't receive a letter, please call our office to schedule the follow-up appointment.  

## 2014-05-23 ENCOUNTER — Ambulatory Visit (HOSPITAL_COMMUNITY): Payer: Medicaid Other | Attending: Cardiology

## 2014-05-23 DIAGNOSIS — R079 Chest pain, unspecified: Secondary | ICD-10-CM | POA: Insufficient documentation

## 2014-05-23 NOTE — Progress Notes (Signed)
2D Echo completed. 05/23/2014 

## 2014-09-05 ENCOUNTER — Other Ambulatory Visit (HOSPITAL_COMMUNITY): Payer: Self-pay | Admitting: Respiratory Therapy

## 2014-09-05 ENCOUNTER — Other Ambulatory Visit (HOSPITAL_COMMUNITY): Payer: Self-pay | Admitting: Rheumatology

## 2014-09-05 DIAGNOSIS — M941 Relapsing polychondritis: Secondary | ICD-10-CM

## 2014-09-17 ENCOUNTER — Ambulatory Visit (HOSPITAL_COMMUNITY)
Admission: RE | Admit: 2014-09-17 | Discharge: 2014-09-17 | Disposition: A | Discharge status: Home / Self Care | Payer: Medicaid Other | Source: Ambulatory Visit | Attending: Rheumatology | Admitting: Rheumatology

## 2014-09-17 DIAGNOSIS — M941 Relapsing polychondritis: Secondary | ICD-10-CM | POA: Diagnosis not present

## 2014-09-17 LAB — SPIROMETRY WITH GRAPH
DL/VA % PRED: 104 %
DL/VA: 5.65 ml/min/mmHg/L
DLCO UNC: 33.16 ml/min/mmHg
DLCO unc % pred: 104 %
FEF 25-75 Pre: 1.63 L/sec
FEF2575-%Pred-Pre: 53 %
FEV1-%Pred-Pre: 76 %
FEV1-PRE: 2.55 L
FEV1FVC-%Pred-Pre: 70 %
FEV6-%PRED-PRE: 108 %
FEV6-PRE: 4.46 L
FEV6FVC-%Pred-Pre: 102 %
FVC-%PRED-PRE: 106 %
FVC-Pre: 4.5 L
PRE FEV6/FVC RATIO: 99 %
Pre FEV1/FVC ratio: 57 %

## 2014-10-22 ENCOUNTER — Ambulatory Visit (INDEPENDENT_AMBULATORY_CARE_PROVIDER_SITE_OTHER): Payer: Medicaid Other | Admitting: Emergency Medicine

## 2014-10-22 ENCOUNTER — Encounter: Payer: Self-pay | Admitting: Emergency Medicine

## 2014-10-22 VITALS — BP 126/82 | HR 77 | Ht 69.5 in | Wt 227.0 lb

## 2014-10-22 DIAGNOSIS — R059 Cough, unspecified: Secondary | ICD-10-CM

## 2014-10-22 DIAGNOSIS — R0600 Dyspnea, unspecified: Secondary | ICD-10-CM

## 2014-10-22 DIAGNOSIS — R05 Cough: Secondary | ICD-10-CM | POA: Diagnosis not present

## 2014-10-22 MED ORDER — ALBUTEROL SULFATE HFA 108 (90 BASE) MCG/ACT IN AERS: 2.0000 | INHALATION_SPRAY | Freq: Four times a day (QID) | RESPIRATORY_TRACT | Status: DC | PRN

## 2014-10-22 MED ORDER — PANTOPRAZOLE SODIUM 40 MG PO TBEC: 40.0000 mg | DELAYED_RELEASE_TABLET | Freq: Every day | ORAL | Status: DC

## 2014-10-22 NOTE — Patient Instructions (Signed)
Please try using albuterol 2  Puffs for shortness of breath to see if you benefit.  Start pantoprazole  daily until our next visit Continue your other medications as ordered We will perform full pulmonary function testing Depending on her response to these changes we will make decision about whether you need bronchoscopy and or a CT scan of your chest Follow with Dr Delton Coombes in 1 month or next available

## 2014-10-22 NOTE — Progress Notes (Signed)
Subjective:    Patient ID: Traci Higgins, female    DOB: 1965/05/27, 49 y.o.   MRN: [id number]  HPI  49 yo never smoker with a hx relapsing polychondritis and seronegative RA that has been managed by Dr Nickola Major with Humira and started on MTX 08/2014, then prednisone restarted. She has been dealing with a chronic cough and shortness of breath. She is referred for evaluation. She believes that her breathing began to change over a year ago, associated with cough. She describes daily cough, barking cough, usually non-productive. Unclear that it has gotten better with pred. She notices dyspnea with significant exertion, also when laying flat. The dyspnea appears to be responsive to prednisone. Spirometry in June '16 was reviewed by me, shows obstruction.     Review of Systems  Constitutional: Negative for fever and unexpected weight change.  HENT: Positive for congestion, dental problem, ear pain, sore throat and trouble swallowing. Negative for nosebleeds, postnasal drip, rhinorrhea, sinus pressure and sneezing.   Eyes: Negative for redness and itching.  Respiratory: Positive for cough and shortness of breath. Negative for chest tightness and wheezing.   Cardiovascular: Positive for chest pain. Negative for palpitations and leg swelling.  Gastrointestinal: Negative for nausea and vomiting.  Genitourinary: Negative for dysuria.  Musculoskeletal: Positive for joint swelling.  Skin: Negative for rash.  Neurological: Positive for headaches.  Hematological: Does not bruise/bleed easily.  Psychiatric/Behavioral: Negative for dysphoric mood. The patient is nervous/anxious.     Past Medical History  Diagnosis Date  . Relapsing polychondritis   . Depression   . Osteopenia   . Dry eye   . Chest pain   . Obesity   . Allergic rhinitis due to pollen   . IBS (irritable bowel syndrome)      Family History  Problem Relation Age of Onset  . Heart attack Mother   . Thyroid disease Mother   .  Hypertension Mother   . Heart attack Maternal Grandmother   . Cancer Maternal Grandfather      History   Social History  . Marital Status: Divorced    Spouse Name: N/A  . Number of Children: N/A  . Years of Education: N/A   Occupational History  . Not on file.   Social History Main Topics  . Smoking status: Never Smoker   . Smokeless tobacco: Not on file  . Alcohol Use: No  . Drug Use: No  . Sexual Activity: Not on file   Other Topics Concern  . Not on file   Social History Narrative     No Known Allergies   Outpatient Prescriptions Prior to Visit  Medication Sig Dispense Refill  . Adalimumab 40 MG/0.8ML PSKT Inject into the skin.    . naproxen sodium (ANAPROX) 550 MG tablet Take 500 mg by mouth 2 (two) times daily with a meal.     . sertraline (ZOLOFT) 50 MG tablet Take 50 mg by mouth daily.     No facility-administered medications prior to visit.        Objective:   Physical Exam Filed Vitals:   10/22/14 1528 10/22/14 1529  BP:  126/82  Pulse:  77  Height: 5' 9.5" (1.765 m)   Weight: 227 lb (102.967 kg)   SpO2:  98%   Gen: Pleasant, well-nourished, in no distress,  normal affect  ENT: No lesions,  mouth clear,  oropharynx clear, some nasal congestion, no septal defect noted.   Neck: No JVD, no TMG, no carotid bruits  Lungs: No use of accessory muscles, no dullness to percussion, clear without rales or rhonchi  Cardiovascular: RRR, heart sounds normal, no murmur or gallops, no peripheral edema  Musculoskeletal: No deformities, no cyanosis or clubbing  Neuro: alert, non focal  Skin: Warm, no lesions or rashes     Assessment & Plan:  Cough Suspect that at least part of her cough is related to GERD. We will try empirically treating with a PPI  Dyspnea Interesting case of dyspnea. Potential causes include obstructive lung disease or dynamic tracheal and bronchial collapse from her polychondritis and its impact on the tracheal rings. She believes  that she made improved some on steroids. Spirometry performed last month was consistent with obstruction although I believe she needs to have this confirmed with full pulmonary function testing. Would like to undertake a trial of albuterol to see if she benefits. Medical results of her pulmonary function testing in her response to bronchodilators we will need to decide when or if to order a CT scan of the chest or to perform bronchoscopy to evaluate for dynamic airway collapse

## 2014-10-25 DIAGNOSIS — R059 Cough, unspecified: Secondary | ICD-10-CM | POA: Insufficient documentation

## 2014-10-25 DIAGNOSIS — R05 Cough: Secondary | ICD-10-CM | POA: Insufficient documentation

## 2014-10-25 DIAGNOSIS — J4520 Mild intermittent asthma, uncomplicated: Secondary | ICD-10-CM | POA: Insufficient documentation

## 2014-10-25 NOTE — Assessment & Plan Note (Signed)
Suspect that at least part of her cough is related to GERD. We will try empirically treating with a PPI

## 2014-10-25 NOTE — Assessment & Plan Note (Signed)
Interesting case of dyspnea. Potential causes include obstructive lung disease or dynamic tracheal and bronchial collapse from her polychondritis and its impact on the tracheal rings. She believes that she made improved some on steroids. Spirometry performed last month was consistent with obstruction although I believe she needs to have this confirmed with full pulmonary function testing. Would like to undertake a trial of albuterol to see if she benefits. Medical results of her pulmonary function testing in her response to bronchodilators we will need to decide when or if to order a CT scan of the chest or to perform bronchoscopy to evaluate for dynamic airway collapse

## 2014-11-28 ENCOUNTER — Ambulatory Visit (INDEPENDENT_AMBULATORY_CARE_PROVIDER_SITE_OTHER): Payer: Medicaid Other | Admitting: Emergency Medicine

## 2014-11-28 ENCOUNTER — Encounter: Payer: Self-pay | Admitting: Emergency Medicine

## 2014-11-28 VITALS — BP 120/80 | HR 95

## 2014-11-28 DIAGNOSIS — R0600 Dyspnea, unspecified: Secondary | ICD-10-CM

## 2014-11-28 DIAGNOSIS — R05 Cough: Secondary | ICD-10-CM

## 2014-11-28 DIAGNOSIS — J849 Interstitial pulmonary disease, unspecified: Secondary | ICD-10-CM | POA: Diagnosis not present

## 2014-11-28 DIAGNOSIS — R059 Cough, unspecified: Secondary | ICD-10-CM

## 2014-11-28 LAB — PULMONARY FUNCTION TEST
DL/VA % pred: 66 %
DL/VA: 3.59 ml/min/mmHg/L
DLCO unc % pred: 63 %
DLCO unc: 20.14 ml/min/mmHg
FEF 25-75 POST: 1.96 L/s
FEF 25-75 Pre: 1.9 L/sec
FEF2575-%Change-Post: 3 %
FEF2575-%PRED-PRE: 62 %
FEF2575-%Pred-Post: 64 %
FEV1-%Change-Post: 5 %
FEV1-%PRED-POST: 84 %
FEV1-%PRED-PRE: 80 %
FEV1-Post: 2.8 L
FEV1-Pre: 2.66 L
FEV1FVC-%Change-Post: 10 %
FEV1FVC-%PRED-PRE: 75 %
FEV6-%CHANGE-POST: -4 %
FEV6-%PRED-POST: 101 %
FEV6-%Pred-Pre: 106 %
FEV6-Post: 4.17 L
FEV6-Pre: 4.35 L
FEV6FVC-%CHANGE-POST: 0 %
FEV6FVC-%PRED-POST: 103 %
FEV6FVC-%Pred-Pre: 102 %
FVC-%Change-Post: -4 %
FVC-%PRED-PRE: 103 %
FVC-%Pred-Post: 98 %
FVC-Post: 4.17 L
FVC-Pre: 4.37 L
PRE FEV1/FVC RATIO: 61 %
Post FEV1/FVC ratio: 67 %
Post FEV6/FVC ratio: 100 %
Pre FEV6/FVC Ratio: 100 %
RV % pred: 55 %
RV: 1.15 L
TLC % PRED: 99 %
TLC: 5.82 L

## 2014-11-28 NOTE — Progress Notes (Signed)
PFT done today. 

## 2014-11-28 NOTE — Assessment & Plan Note (Signed)
Her cough does sound upper airway in nature. It did not seem to change very much with the addition of pantoprazole. Like for her to continue this for now and we will empirically treat her for chronic rhinitis. He has not tolerated fluticasone nasal spray in the past so I will instead ask her to start loratadine, phentermine when necessary, nasal saline washes.

## 2014-11-28 NOTE — Progress Notes (Signed)
Subjective:    Patient ID: Traci Higgins, female    DOB: 1965/05/14, 49 y.o.   MRN: [id number]  Shortness of Breath Associated symptoms include chest pain, ear pain, headaches and a sore throat. Pertinent negatives include no fever, leg swelling, rash, rhinorrhea, vomiting or wheezing.    49 yo never smoker with a hx relapsing polychondritis and seronegative RA that has been managed by Dr Nickola Major with Humira and started on MTX 08/2014, then prednisone restarted. She has been dealing with a chronic cough and shortness of breath. She is referred for evaluation. She believes that her breathing began to change over a year ago, associated with cough. She describes daily cough, barking cough, usually non-productive. Unclear that it has gotten better with pred. She notices dyspnea with significant exertion, also when laying flat. The dyspnea appears to be responsive to prednisone. Spirometry in June '16 was reviewed by me, shows obstruction.    ROV 11/28/14 -- follow up for dyspnea chronic cough, hx RA on immunosuppression. At her initial vision we empirically started pantoprazole for possible GERD related cough. We also performed pulmonary function testing personal review today. This shows mild obstructive lung disease with out a bronchodilator response, suggests restrictive disease based on a decreased residual volume and confirms decreased diffusion capacity. We performed a trial of albuterol to see if she could notice an effect - may have had some transient improvement in her breathing. She is now down to Prednisone . She does have congestion, allergic sx, ear stuffiness. She did take PPI since last time and did not improve her cough. She has a normal TTE in February.    Review of Systems  Constitutional: Negative for fever and unexpected weight change.  HENT: Positive for congestion, dental problem, ear pain, sore throat and trouble swallowing. Negative for nosebleeds, postnasal drip, rhinorrhea,  sinus pressure and sneezing.   Eyes: Negative for redness and itching.  Respiratory: Positive for cough and shortness of breath. Negative for chest tightness and wheezing.   Cardiovascular: Positive for chest pain. Negative for palpitations and leg swelling.  Gastrointestinal: Negative for nausea and vomiting.  Genitourinary: Negative for dysuria.  Musculoskeletal: Positive for joint swelling.  Skin: Negative for rash.  Neurological: Positive for headaches.  Hematological: Does not bruise/bleed easily.  Psychiatric/Behavioral: Negative for dysphoric mood. The patient is nervous/anxious.         Objective:   Physical Exam Filed Vitals:   11/28/14 1326  BP: 120/80  Pulse: 95  SpO2: 98%   Gen: Pleasant, well-nourished, in no distress,  normal affect  ENT: No lesions,  mouth clear,  oropharynx clear, some nasal congestion, no septal defect noted.   Neck: No JVD, no TMG, no carotid bruits  Lungs: No use of accessory muscles, clear without rales or rhonchi  Cardiovascular: RRR, heart sounds normal, no murmur or gallops, no peripheral edema  Musculoskeletal: No deformities, no cyanosis or clubbing  Neuro: alert, non focal  Skin: Warm, no lesions or rashes     Assessment & Plan:  Cough Her cough does sound upper airway in nature. It did not seem to change very much with the addition of pantoprazole. Like for her to continue this for now and we will empirically treat her for chronic rhinitis. He has not tolerated fluticasone nasal spray in the past so I will instead ask her to start loratadine, phentermine when necessary, nasal saline washes.   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 She may have some benefit  from albuterol alone not to a significant degree. Given her history of autoimmune disease, Humira use and methotrexate use I question whether she could have a pneumonitis or some form of interstitial change. I would like to perform a CT  scan of her chest to evaluate further and then follow up with her to review. It may be necessary for [place] to reconsider her anti-inflammatory medications depending on the results. Depending on the result of the CT scan we will decide whether bronchoscopy for BAL and/or biopsies is indicated

## 2014-11-28 NOTE — Patient Instructions (Signed)
Please start loratadine  daily Use chlorpheniramine  up to every 6 hours if needed for congestion.  Continue your pantoprazole for now Keep albuterol available to use 2 puffs as needed for shortness of breath.  We will perform a high resolution CT scan of the chest  Follow with Dr Delton Coombes

## 2014-11-28 NOTE — Assessment & Plan Note (Signed)
Her pulmonary function testing shows mild obstructive lung disease. She may have some benefit from albuterol alone not to a significant degree. Given her history of autoimmune disease, Humira use and methotrexate use I question whether she could have a pneumonitis or some form of interstitial change. I would like to perform a CT scan of her chest to evaluate further and then follow up with her to review. It may be necessary for [place] to reconsider her anti-inflammatory medications depending on the results. Depending on the result of the CT scan we will decide whether bronchoscopy for BAL and/or biopsies is indicated

## 2014-12-05 ENCOUNTER — Ambulatory Visit (INDEPENDENT_AMBULATORY_CARE_PROVIDER_SITE_OTHER)
Admission: RE | Admit: 2014-12-05 | Discharge: 2014-12-05 | Disposition: A | Discharge status: Home / Self Care | Payer: Medicaid Other | Source: Ambulatory Visit | Attending: Emergency Medicine | Admitting: Emergency Medicine

## 2014-12-05 DIAGNOSIS — J849 Interstitial pulmonary disease, unspecified: Secondary | ICD-10-CM

## 2014-12-19 ENCOUNTER — Encounter: Payer: Self-pay | Admitting: Emergency Medicine

## 2014-12-19 ENCOUNTER — Ambulatory Visit (INDEPENDENT_AMBULATORY_CARE_PROVIDER_SITE_OTHER): Payer: Medicaid Other | Admitting: Emergency Medicine

## 2014-12-19 VITALS — BP 138/80 | HR 63 | Wt 234.0 lb

## 2014-12-19 DIAGNOSIS — R05 Cough: Secondary | ICD-10-CM

## 2014-12-19 DIAGNOSIS — R059 Cough, unspecified: Secondary | ICD-10-CM

## 2014-12-19 DIAGNOSIS — R0600 Dyspnea, unspecified: Secondary | ICD-10-CM

## 2014-12-19 DIAGNOSIS — R918 Other nonspecific abnormal finding of lung field: Secondary | ICD-10-CM | POA: Diagnosis not present

## 2014-12-19 NOTE — Patient Instructions (Signed)
Your Ct scan shows some scattered small pulmonary nodules that are likely related to rheumatoid disease. We will discuss the timing of any followup imaging for these at a future visit Continue your current medications as you are taking them  Follow with Dr Delton Coombes in 6 months or sooner if you have any problems

## 2014-12-19 NOTE — Assessment & Plan Note (Signed)
Better but not resolved with PPI plus Zantac

## 2014-12-19 NOTE — Assessment & Plan Note (Signed)
Scattered small nodules, some are subpleural. Suspect related to rheumatoid arthritis in this low risk patient. We will discuss whether she needs any further CT scanning or evaluation at a future visit

## 2014-12-19 NOTE — Progress Notes (Signed)
Subjective:    Patient ID: Traci Higgins, female    DOB: [birth year], 49 y.o.   MRN: [id number]  Shortness of Breath Associated symptoms include a sore throat. Pertinent negatives include no chest pain, ear pain, fever, headaches, leg swelling, rash, rhinorrhea, vomiting or wheezing.   49 yo never smoker with a hx relapsing polychondritis and seronegative RA that has been managed by Dr Nickola Major with Humira and started on MTX 08/2014, then prednisone restarted. She has been dealing with a chronic cough and shortness of breath. She is referred for evaluation. She believes that her breathing began to change over a year ago, associated with cough. She describes daily cough, barking cough, usually non-productive. Unclear that it has gotten better with pred. She notices dyspnea with significant exertion, also when laying flat. The dyspnea appears to be responsive to prednisone. Spirometry in June '16 was reviewed by me, shows obstruction.    ROV 11/28/14 -- follow up for dyspnea chronic cough, hx RA on immunosuppression. At her initial vision we empirically started pantoprazole for possible GERD related cough. We also performed pulmonary function testing personal review today. This shows mild obstructive lung disease with out a bronchodilator response, suggests restrictive disease based on a decreased residual volume and confirms decreased diffusion capacity. We performed a trial of albuterol to see if she could notice an effect - may have had some transient improvement in her breathing. She is now down to Prednisone . She does have congestion, allergic sx, ear stuffiness. She did take PPI since last time and did not improve her cough. She has a normal TTE in February.   ROV 12/19/14 -- follow-up visit for dyspnea on exertion as well as cough. She has a history of autoimmune disease and rheumatoid arthritis on an immunosuppression regimen with methotrexate, recently increased. We have suspected that her cough  was related to GERD and upper airway irritation. She has pulmonary function testing that confirms mild obstructive lung disease and may have responded to a trial of albuterol  Elpidio Anis, PA-C [facility] [address] [place] [address]   Review of Systems  Constitutional: Negative for fever and unexpected weight change.  HENT: Positive for congestion and sore throat. Negative for dental problem, ear pain, nosebleeds, postnasal drip, rhinorrhea, sinus pressure, sneezing and trouble swallowing.   Eyes: Negative for redness and itching.  Respiratory: Positive for cough and shortness of breath. Negative for chest tightness and wheezing.   Cardiovascular: Negative for chest pain, palpitations and leg swelling.  Gastrointestinal: Negative for nausea and vomiting.  Genitourinary: Negative for dysuria.  Musculoskeletal: Positive for joint swelling.  Skin: Negative for rash.  Neurological: Negative for headaches.  Hematological: Does not bruise/bleed easily.  Psychiatric/Behavioral: Negative for dysphoric mood.        Objective:   Physical Exam Filed Vitals:   12/19/14 1553  BP: 138/80  Pulse: 63  Weight: 234 lb (106.142 kg)  SpO2: 99%   Gen: Pleasant, well-nourished, in no distress,  normal affect  ENT: No lesions,  mouth clear,  oropharynx clear, some nasal congestion, no septal defect noted.   Neck: No JVD, no TMG, no carotid bruits  Lungs: No use of accessory muscles, clear without rales or rhonchi  Cardiovascular: RRR, heart sounds normal, no murmur or gallops, no peripheral edema  Musculoskeletal: No deformities, no cyanosis or clubbing  Neuro: alert, non focal  Skin: Warm, no lesions or rashes     Assessment & Plan:  Pulmonary nodules Scattered  small nodules, some are subpleural. Suspect related to rheumatoid arthritis in this low risk patient. We will discuss whether she needs any further CT scanning or evaluation at a future  visit  Dyspnea Her CT scan of the chest does not show any evidence for interstitial lung disease related to either rheumatoid disease, or autoimmune medication. I do not believe she needs bronchoscopy at this time. She does have mild obstruction and seems to be benefiting some from albuterol.   Cough Better but not resolved with PPI plus Zantac

## 2014-12-19 NOTE — Assessment & Plan Note (Addendum)
Her CT scan of the chest does not show any evidence for interstitial lung disease related to either rheumatoid disease, or autoimmune medication. I do not believe she needs bronchoscopy at this time. She does have mild obstruction and seems to be benefiting some from albuterol.

## 2015-06-24 ENCOUNTER — Ambulatory Visit: Payer: Medicaid Other | Admitting: Emergency Medicine

## 2015-09-23 ENCOUNTER — Encounter: Payer: Self-pay | Admitting: Emergency Medicine

## 2015-09-23 ENCOUNTER — Ambulatory Visit (INDEPENDENT_AMBULATORY_CARE_PROVIDER_SITE_OTHER): Payer: Medicaid Other | Admitting: Emergency Medicine

## 2015-09-23 VITALS — BP 116/80 | HR 80 | Ht 69.75 in | Wt 234.0 lb

## 2015-09-23 DIAGNOSIS — R918 Other nonspecific abnormal finding of lung field: Secondary | ICD-10-CM | POA: Diagnosis not present

## 2015-09-23 DIAGNOSIS — J4520 Mild intermittent asthma, uncomplicated: Secondary | ICD-10-CM | POA: Diagnosis not present

## 2015-09-23 DIAGNOSIS — R05 Cough: Secondary | ICD-10-CM | POA: Diagnosis not present

## 2015-09-23 DIAGNOSIS — R059 Cough, unspecified: Secondary | ICD-10-CM

## 2015-09-23 MED ORDER — ALBUTEROL SULFATE HFA 108 (90 BASE) MCG/ACT IN AERS: 2.0000 | INHALATION_SPRAY | Freq: Four times a day (QID) | RESPIRATORY_TRACT | Status: AC | PRN

## 2015-09-23 MED ORDER — BUDESONIDE-FORMOTEROL FUMARATE 160-4.5 MCG/ACT IN AERO: 2.0000 | INHALATION_SPRAY | Freq: Two times a day (BID) | RESPIRATORY_TRACT | Status: DC

## 2015-09-23 NOTE — Patient Instructions (Addendum)
We will repeat your Ct chest without contrast after September 2017 to compare with your prior film Please start Symbicort 2 puffs twice a day We will refill your albuterol to use 2 puffs up to every 4 hours if needed for shortness of breath.  Follow with ENT to discuss your nasal congestion, consider medications for your nasal drainage.  Follow with Dr Delton CoombesByrum next available after your CT scan to review, or sooner if you have any problems.

## 2015-09-23 NOTE — Assessment & Plan Note (Addendum)
Noted on her CT in September 2016. Suspect that these are rheumatoid nodules but certainly consider atypical infection given her immunosuppression. She needs a repeat CT scan of the chest in September to look for interval change. We will evaluate after the repeat scan to determine whether any intervention is needed.

## 2015-09-23 NOTE — Progress Notes (Signed)
Subjective:    Patient ID: Traci Higgins, female    DOB: 1966-01-04, 50 y.o.   MRN: [id number]  Shortness of Breath Associated symptoms include a sore throat. Pertinent negatives include no chest pain, ear pain, fever, headaches, leg swelling, rash, rhinorrhea, vomiting or wheezing.   50 yo never smoker with a hx relapsing polychondritis and seronegative RA that has been managed by Dr Nickola MajorHawkes with Humira and started on MTX 08/2014, then prednisone restarted. She has been dealing with a chronic cough and shortness of breath. She is referred for evaluation. She believes that her breathing began to change over a year ago, associated with cough. She describes daily cough, barking cough, usually non-productive. Unclear that it has gotten better with pred. She notices dyspnea with significant exertion, also when laying flat. The dyspnea appears to be responsive to prednisone. Spirometry in June '16 was reviewed by me, shows obstruction.    ROV 11/28/14 -- follow up for dyspnea chronic cough, hx RA on immunosuppression. At her initial vision we empirically started pantoprazole for possible GERD related cough. We also performed pulmonary function testing personal review today. This shows mild obstructive lung disease with out a bronchodilator response, suggests restrictive disease based on a decreased residual volume and confirms decreased diffusion capacity. We performed a trial of albuterol to see if she could notice an effect - may have had some transient improvement in her breathing. She is now down to Prednisone 5mg . She does have congestion, allergic sx, ear stuffiness. She did take PPI since last time and did not improve her cough. She has a normal TTE in February.   ROV 12/19/14 -- follow-up visit for dyspnea on exertion as well as cough. She has a history of autoimmune disease and rheumatoid arthritis on an immunosuppression regimen with methotrexate, recently increased. We have suspected that her cough  was related to GERD and upper airway irritation. She has pulmonary function testing that confirms mild obstructive lung disease and may have responded to a trial of albuterol  ROV 09/23/15 -- patient has a history of rheumatoid arthritis on suppressive therapy, chronic cough that may be related to GERD and upper airway obstruction. She also had some mild obstruction noted on spirometry. A CT scan of the chest done 12/05/14 was personally reviewed by me. This showed multiple small pulmonary nodules bilaterally in absence of any significant fibrotic disease. There is possible air trapping. Methotrexate, Humira.  Prednisone has been weaned to off since last time, humira and MTX regimens have been increased some. She continues to have cough, nasal gtt, rare GERD. She ran out of albuterol 2 months ago, feels that she has missed it. She used to use it up 2x a day, at least once a day  Elpidio Anisrin Gray, Cordelia Poche-C [facility] [address] [place] [phone]   Review of Systems  Constitutional: Negative for fever and unexpected weight change.  HENT: Positive for congestion and sore throat. Negative for dental problem, ear pain, nosebleeds, postnasal drip, rhinorrhea, sinus pressure, sneezing and trouble swallowing.   Eyes: Negative for redness and itching.  Respiratory: Positive for cough and shortness of breath. Negative for chest tightness and wheezing.   Cardiovascular: Negative for chest pain, palpitations and leg swelling.  Gastrointestinal: Negative for nausea and vomiting.  Genitourinary: Negative for dysuria.  Musculoskeletal: Positive for joint swelling.  Skin: Negative for rash.  Neurological: Negative for headaches.  Hematological: Does not bruise/bleed easily.  Psychiatric/Behavioral: Negative for dysphoric mood.  Objective:   Physical Exam Filed Vitals:   09/23/15 1011  BP: 116/80  Pulse: 80  Height: 5' 9.75" (1.772 m)  Weight: 234 lb (106.142 kg)  SpO2: 96%    Gen: Pleasant, well-nourished, in no distress,  normal affect  ENT: No lesions,  mouth clear,  oropharynx clear, some nasal congestion, no septal defect noted.   Neck: No JVD, no TMG, no carotid bruits  Lungs: No use of accessory muscles, clear without rales or rhonchi  Cardiovascular: RRR, heart sounds normal, no murmur or gallops, no peripheral edema  Musculoskeletal: No deformities, no cyanosis or clubbing  Neuro: alert, non focal  Skin: Warm, no lesions or rashes     Assessment & Plan:  Pulmonary nodules Noted on her CT in September 2016. Suspect that these are rheumatoid nodules but certainly consider atypical infection given her immunosuppression. She needs a repeat CT scan of the chest in September to look for interval change. We will evaluate after the repeat scan to determine whether any intervention is needed.  Mild intermittent asthma She continues to have symptoms in fact more so since she ran out of her albuterol. I like to start her on Symbicort twice a day see if she benefits. I will also refill her albuterol to use when necessary.  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 She is scheduled to follow-up with ENT soon   Levy Pupaobert Alieu Finnigan, MD, PhD 09/23/2015, 10:38 AM [facility] [phone] or if no answer [phone]

## 2015-09-23 NOTE — Assessment & Plan Note (Signed)
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 She is scheduled to follow-up with ENT soon

## 2015-09-23 NOTE — Assessment & Plan Note (Signed)
She continues to have symptoms in fact more so since she ran out of her albuterol. I like to start her on Symbicort twice a day see if she benefits. I will also refill her albuterol to use when necessary.

## 2015-09-26 ENCOUNTER — Encounter: Payer: Self-pay | Admitting: Emergency Medicine

## 2015-10-29 ENCOUNTER — Emergency Department (HOSPITAL_COMMUNITY): Payer: Self-pay

## 2015-10-29 ENCOUNTER — Encounter (HOSPITAL_COMMUNITY): Payer: Self-pay | Admitting: *Deleted

## 2015-10-29 ENCOUNTER — Emergency Department (HOSPITAL_COMMUNITY)
Admission: EM | Admit: 2015-10-29 | Discharge: 2015-10-30 | Disposition: A | Discharge status: Home / Self Care | Payer: Self-pay | Attending: Emergency Medicine | Admitting: Emergency Medicine

## 2015-10-29 DIAGNOSIS — Y999 Unspecified external cause status: Secondary | ICD-10-CM | POA: Insufficient documentation

## 2015-10-29 DIAGNOSIS — W010XXA Fall on same level from slipping, tripping and stumbling without subsequent striking against object, initial encounter: Secondary | ICD-10-CM | POA: Insufficient documentation

## 2015-10-29 DIAGNOSIS — Y929 Unspecified place or not applicable: Secondary | ICD-10-CM | POA: Insufficient documentation

## 2015-10-29 DIAGNOSIS — F329 Major depressive disorder, single episode, unspecified: Secondary | ICD-10-CM | POA: Insufficient documentation

## 2015-10-29 DIAGNOSIS — S52612A Displaced fracture of left ulna styloid process, initial encounter for closed fracture: Secondary | ICD-10-CM | POA: Insufficient documentation

## 2015-10-29 DIAGNOSIS — R52 Pain, unspecified: Secondary | ICD-10-CM

## 2015-10-29 DIAGNOSIS — S52502A Unspecified fracture of the lower end of left radius, initial encounter for closed fracture: Secondary | ICD-10-CM | POA: Insufficient documentation

## 2015-10-29 DIAGNOSIS — Y939 Activity, unspecified: Secondary | ICD-10-CM | POA: Insufficient documentation

## 2015-10-29 DIAGNOSIS — R791 Abnormal coagulation profile: Secondary | ICD-10-CM | POA: Insufficient documentation

## 2015-10-29 LAB — CBC
HCT: 37.8 % (ref 36.0–46.0)
Hemoglobin: 12.6 g/dL (ref 12.0–15.0)
MCH: 30.1 pg (ref 26.0–34.0)
MCHC: 33.3 g/dL (ref 30.0–36.0)
MCV: 90.2 fL (ref 78.0–100.0)
PLATELETS: 186 10*3/uL (ref 150–400)
RBC: 4.19 MIL/uL (ref 3.87–5.11)
RDW: 14.2 % (ref 11.5–15.5)
WBC: 8.7 10*3/uL (ref 4.0–10.5)

## 2015-10-29 LAB — PROTIME-INR
INR: 0.94
PROTHROMBIN TIME: 12.6 s (ref 11.4–15.2)

## 2015-10-29 LAB — COMPREHENSIVE METABOLIC PANEL
ALT: 22 U/L (ref 14–54)
AST: 26 U/L (ref 15–41)
Albumin: 4.1 g/dL (ref 3.5–5.0)
Alkaline Phosphatase: 82 U/L (ref 38–126)
Anion gap: 9 (ref 5–15)
BUN: 18 mg/dL (ref 6–20)
CHLORIDE: 106 mmol/L (ref 101–111)
CO2: 24 mmol/L (ref 22–32)
Calcium: 9.5 mg/dL (ref 8.9–10.3)
Creatinine, Ser: 0.82 mg/dL (ref 0.44–1.00)
Glucose, Bld: 89 mg/dL (ref 65–99)
POTASSIUM: 3.5 mmol/L (ref 3.5–5.1)
SODIUM: 139 mmol/L (ref 135–145)
Total Bilirubin: 0.4 mg/dL (ref 0.3–1.2)
Total Protein: 7.7 g/dL (ref 6.5–8.1)

## 2015-10-29 MED ORDER — OXYCODONE-ACETAMINOPHEN 5-325 MG PO TABS
ORAL_TABLET | ORAL | Status: AC
  Filled 2015-10-29: qty 1

## 2015-10-29 MED ORDER — OXYCODONE-ACETAMINOPHEN 5-325 MG PO TABS
1.0000 | ORAL_TABLET | Freq: Once | ORAL | Status: AC
  Administered 2015-10-29: 1 via ORAL

## 2015-10-29 MED ORDER — BUPIVACAINE HCL (PF) 0.5 % IJ SOLN
10.0000 mL | Freq: Once | INTRAMUSCULAR | Status: AC
  Administered 2015-10-29: 10 mL
  Filled 2015-10-29: qty 10

## 2015-10-29 MED ORDER — LIDOCAINE HCL (PF) 1 % IJ SOLN
5.0000 mL | Freq: Once | INTRAMUSCULAR | Status: AC
  Administered 2015-10-29: 5 mL
  Filled 2015-10-29: qty 5

## 2015-10-29 NOTE — ED Provider Notes (Signed)
[facility]-EMERGENCY DEPT Provider Note   CSN: [id number] Arrival date & time: 10/29/15  2033  First Provider Contact:  None       History   Chief Complaint Chief Complaint  Patient presents with  . Wrist Injury    HPI Traci Higgins is a 50 y.o. female.   Fall  This is a new problem. The current episode started today. Episode frequency: once. The problem has been gradually improving. Associated symptoms include arthralgias (chronic). Pertinent negatives include no abdominal pain, change in bowel habit, chest pain, coughing, diaphoresis, headaches, joint swelling, nausea, neck pain, visual change or vomiting. Exacerbated by: palpation to L wrist. She has tried rest and immobilization for the symptoms. The treatment provided mild relief.    Past Medical History:  Diagnosis Date  . Allergic rhinitis due to pollen   . Chest pain   . Depression   . Dry eye   . IBS (irritable bowel syndrome)   . Obesity   . Osteopenia   . Relapsing polychondritis     Patient Active Problem List   Diagnosis Date Noted  . Pulmonary nodules 12/19/2014  . Mild intermittent asthma 10/25/2014  . Cough 10/25/2014    History reviewed. No pertinent surgical history.  OB History    No data available       Home Medications    Prior to Admission medications   Medication Sig Start Date End Date Taking? Authorizing Provider  Adalimumab 40 MG/0.8ML PSKT Inject 40 mg into the skin once a week.     Historical Provider, MD  albuterol (PROVENTIL HFA;VENTOLIN HFA) 108 (90 Base) MCG/ACT inhaler Inhale 2 puffs into the lungs every 6 (six) hours as needed for wheezing or shortness of breath. 09/23/15   Leslye Peer, MD  budesonide-formoterol [facility]) 160-4.5 MCG/ACT inhaler Inhale 2 puffs into the lungs 2 (two) times daily. 09/23/15   Leslye Peer, MD  Methotrexate, PF, 30 MG/0.6ML SOAJ Inject 0.6 mLs into the skin once a week.    Historical Provider, MD  naproxen sodium (ANAPROX) 550 MG tablet Take  500 mg by mouth 2 (two) times daily with a meal.     Historical Provider, MD  sertraline (ZOLOFT) 50 MG tablet Take 50 mg by mouth daily.    Historical Provider, MD    Family History Family History  Problem Relation Age of Onset  . Heart attack Mother   . Thyroid disease Mother   . Hypertension Mother   . Heart attack Maternal Grandmother   . Cancer Maternal Grandfather     Social History Social History  Substance Use Topics  . Smoking status: Never Smoker  . Smokeless tobacco: Not on file  . Alcohol use No     Allergies   Review of patient's allergies indicates no known allergies.   Review of Systems Review of Systems  Constitutional: Negative for diaphoresis.  HENT: Negative for rhinorrhea.   Eyes: Negative for discharge.  Respiratory: Negative for cough.   Cardiovascular: Negative for chest pain.  Gastrointestinal: Negative for abdominal pain, change in bowel habit, nausea and vomiting.  Genitourinary: Negative for dysuria.  Musculoskeletal: Positive for arthralgias (chronic). Negative for joint swelling and neck pain.  Skin: Negative for pallor.  Neurological: Negative for syncope and headaches.  Psychiatric/Behavioral: Negative for agitation and confusion.     Physical Exam Updated Vital Signs BP 139/76 (BP Location: Right Arm)   Pulse 76   Temp 98.1 F (36.7 C) (Oral)   Resp 20   Ht  5\' 10"  (1.778 m)   Wt 106.7 kg   LMP 12/19/2012   SpO2 99%   BMI 33.75 kg/m   Physical Exam  Constitutional: She is oriented to person, place, and time. She appears well-developed and well-nourished. She appears distressed (mild).  HENT:  Head: Normocephalic and atraumatic.  Denies head trauma today, but do have bruising underneath her L eye where she got hit with a falling hammer  Eyes: Conjunctivae are normal.  Neck: Normal range of motion. Neck supple. No tracheal deviation present.  Cardiovascular: Normal rate, regular rhythm, normal heart sounds and intact distal  pulses.   No murmur heard. Pulmonary/Chest: Effort normal and breath sounds normal. No stridor. No respiratory distress. She has no wheezes. She has no rales.  Abdominal: Soft. She exhibits no distension. There is no tenderness. There is no rebound and no guarding.  Musculoskeletal: She exhibits tenderness (L wrist) and deformity (L wrist deformity). She exhibits no edema.  L wrist NVI. No sensory deficit but reports Subjective paresthesias in L hand, not in nerve distribution ROM of fingers and wrist limited by pain  Neurological: She is alert and oriented to person, place, and time.  Skin: Skin is warm and dry. Capillary refill takes less than 2 seconds. She is not diaphoretic.  Psychiatric: She has a normal mood and affect.  Nursing note and vitals reviewed.    ED Treatments / Results  Labs (all labs ordered are listed, but only abnormal results are displayed) Labs Reviewed  CBC  COMPREHENSIVE METABOLIC PANEL  PROTIME-INR    EKG  EKG Interpretation None       Radiology Dg Forearm Left  Result Date: 10/29/2015 CLINICAL DATA:  50 year old female with fall and deformity of the distal forearm EXAM: LEFT WRIST - COMPLETE 3+ VIEW; LEFT FOREARM - 2 VIEW COMPARISON:  None. FINDINGS: There is a comminuted and dorsally angulated fracture of the distal radius. Evaluation for inter articular extension somewhat limited on the basis of this radiograph. There is a nondisplaced fracture of the ulnar-styloid. No other fracture identified. There is no dislocation. There is diffuse soft tissue swelling of the wrist. No radiopaque foreign object identified. IMPRESSION: Comminuted and dorsally angulated fracture of the distal radius as well as fracture of the ulnar-styloid. Electronically Signed   By: Elgie Collard M.D.   On: 10/29/2015 22:19   Dg Wrist 2 Views Left  Result Date: 10/29/2015 CLINICAL DATA:  Post reduction. EXAM: LEFT WRIST - 2 VIEW COMPARISON:  LEFT wrist radiograph October 29, 2014  at 2154 hours FINDINGS: Interval placement of steak plaster cast transfixing distal radial metaphysis fracture which appears in alignment. Nondisplaced ulnar styloid fracture. No dislocation. Soft tissue swelling. IMPRESSION: Distal radial and ulnar fractures appear in alignment, an plaster cast. Electronically Signed   By: Awilda Metro M.D.   On: 10/29/2015 23:58   Dg Wrist Complete Left  Result Date: 10/29/2015 CLINICAL DATA:  51 year old female with fall and deformity of the distal forearm EXAM: LEFT WRIST - COMPLETE 3+ VIEW; LEFT FOREARM - 2 VIEW COMPARISON:  None. FINDINGS: There is a comminuted and dorsally angulated fracture of the distal radius. Evaluation for inter articular extension somewhat limited on the basis of this radiograph. There is a nondisplaced fracture of the ulnar-styloid. No other fracture identified. There is no dislocation. There is diffuse soft tissue swelling of the wrist. No radiopaque foreign object identified. IMPRESSION: Comminuted and dorsally angulated fracture of the distal radius as well as fracture of the ulnar-styloid. Electronically Signed  By: Elgie Collard M.D.   On: 10/29/2015 22:19    Procedures Procedures (including critical care time)  Medications Ordered in ED Medications  oxyCODONE-acetaminophen (PERCOCET/ROXICET) 5-325 MG per tablet 1 tablet (1 tablet Oral Given 10/29/15 2106)  bupivacaine (MARCAINE) 0.5 % injection 10 mL (10 mLs Infiltration Given 10/29/15 2231)  lidocaine (PF) (XYLOCAINE) 1 % injection 5 mL (5 mLs Other Given 10/29/15 2231)     Initial Impression / Assessment and Plan / ED Course  I have reviewed the triage vital signs and the nursing notes.  Pertinent labs & imaging results that were available during my care of the patient were reviewed by me and considered in my medical decision making (see chart for details).  Clinical Course   50 yo female with history of relapsing polychondritis, currently taking Adalimumab and  methotrexate, presents after a mechanical fall earlier in the evening. Patient fell and is currently experiencing pain along her coccyx and left wrist. Patient has been able to ambulate without difficulty. Stable pelvis, no hip pain. Also had bruise along L eye but patient denies facial pain. Left wrist is visibly dorsally displaced, swollen and tender. Plain films showed a distal radius fracture and ulnar styloid fracture for which hand was consult did. States fracture was reduced at bedside with hematoma block by hand and patient was splinted in place. Basic blood work was obtained and was grossly unremarkable. Patient had pain additionally addressed with Percocet. Patient tolerated reduction well and postreduction films were satisfactory. Diffuse numbness 2/2 block but good cap refill and motor post splint. Patient will likely require surgery, however can be deferred at this time. Hand will discuss follow-up hand surgery appointment. [facility] drug database's consultation and no red flags were identified. Patient was discharged with short course of Percocet. Discussed using and only has breakthrough therapy. Discouraged patient to drive or operate heavy machinery while taking medications. Discussed plan results of the patient. All questions answered. Agreed with plan. Usual and customary return precautions were discussed subsequent to fractures.  Final Clinical Impressions(s) / ED Diagnoses   Final diagnoses:  Distal radius fracture, left, closed, initial encounter  Fracture of ulnar styloid, left, closed, initial encounter    New Prescriptions Discharge Medication List as of 10/30/2015  1:03 AM    START taking these medications   Details  HYDROcodone-acetaminophen (NORCO/VICODIN) 5-325 MG tablet Take 1 tablet by mouth every 6 (six) hours as needed for severe pain., Starting Thu 10/30/2015, Print         Maretta Bees, MD 10/30/15 3532    Margarita Grizzle, MD 11/01/15 [phone]

## 2015-10-29 NOTE — Consult Note (Signed)
  ORTHOPAEDIC CONSULTATION  REQUESTING PHYSICIAN: Margarita Grizzle, MD  Chief Complaint: Left wrist pain  HPI: Traci Higgins is a 50 y.o. female who complains of  acute severe left wrist pain after mechanical fall. Gross deformity. Also has pain along her tailbone. Pain along her wrist is worse with movement. Better with rest. Denies significant numbness or tingling. Injury happened today.  Past Medical History:  Diagnosis Date  . Allergic rhinitis due to pollen   . Chest pain   . Depression   . Dry eye   . IBS (irritable bowel syndrome)   . Obesity   . Osteopenia   . Relapsing polychondritis    History reviewed. No pertinent surgical history. Social History   Social History  . Marital status: Divorced    Spouse name: N/A  . Number of children: N/A  . Years of education: N/A   Social History Main Topics  . Smoking status: Never Smoker  . Smokeless tobacco: None  . Alcohol use No  . Drug use: No  . Sexual activity: Not Asked   Other Topics Concern  . None   Social History Narrative  . None   Family History  Problem Relation Age of Onset  . Heart attack Mother   . Thyroid disease Mother   . Hypertension Mother   . Heart attack Maternal Grandmother   . Cancer Maternal Grandfather    No Known Allergies   Positive ROS: All other systems have been reviewed and were otherwise negative with the exception of those mentioned in the HPI and as above.  Physical Exam: General: Alert, no acute distress Cardiovascular: No pedal edema Respiratory: No cyanosis, no use of accessory musculature GI: No organomegaly, abdomen is soft and non-tender Skin: No lesions in the area of chief complaint Neurologic: Sensation intact distally Psychiatric: Patient is competent for consent with normal mood and affect Lymphatic: No axillary or cervical lymphadenopathy  MUSCULOSKELETAL: Left wrist has gross deformity with characteristic pattern consistent with distal radius fracture.  Sensation intact throughout the fingers. All fingers flex extend and abduct. Positive pain to palpation along the wrist. Minimal pain around the elbow.  Assessment: Acute left distal radius fracture, coexisting comorbidities that this indicated above.  Plan: This is an acute severe injury, and his likely need surgical intervention. I'm recommending closed reduction under hematoma block to be performed tonight, and we will also apply a splint and get postreduction x-rays. I'll plan to see her back in the office on Monday, and coordinate planning for surgery which will likely be sometime next week.  Preprocedure diagnosis: Left distal radius fracture  Postprocedure diagnosis: Same  Procedure: Closed reduction left distal radius fracture  Procedure details: After informed verbal consent was obtained the left dorsal wrist was prepared with chlorhexidine and a mixture of 10 mL of half percent Marcaine and 5 mL of 1% Xylocaine were injected as a hematoma block. Once satisfactory anesthesia had been achieved, closed reduction was performed followed by application of a sugar tong splint. Postreduction x-rays have been ordered. She tolerated the procedure well and there were no complications.    Eulas Post, MD Cell [phone]   10/29/2015 9:48 PM

## 2015-10-29 NOTE — ED Triage Notes (Signed)
Pt fell about a hour ago pt tripped and fell onto left wrist. Pt's left wrist has obvious deformity, CMS intact, strong radial pulse.

## 2015-10-29 NOTE — Progress Notes (Signed)
Orthopedic Tech Progress Note Patient Details:  Traci Higgins 22-Sep-1965 [id number]  Ortho Devices Type of Ortho Device: Sugartong splint Ortho Device/Splint Location: lue plaster sugartong splint Ortho Device/Splint Interventions: Ordered, Application Assisted dr with reduction and applying plaster sugartong splint.  [name] 10/29/2015, 10:50 PM

## 2015-10-30 MED ORDER — HYDROCODONE-ACETAMINOPHEN 5-325 MG PO TABS: 1.0000 | ORAL_TABLET | Freq: Four times a day (QID) | ORAL | 0 refills | Status: DC | PRN

## 2015-10-30 NOTE — Discharge Instructions (Signed)
USE NSAIDs LIKE MOTRIN (400MG  EVERY 6 HOURS) BEFORE USING NARCOTICS FOR PAIN CONTROL  DO NOT DRIVE OR USE HEAVY MACHINERY WHEN USING NARCOTIC PAIN MEDICATION

## 2015-11-03 ENCOUNTER — Encounter (HOSPITAL_BASED_OUTPATIENT_CLINIC_OR_DEPARTMENT_OTHER): Payer: Self-pay | Admitting: *Deleted

## 2015-11-03 ENCOUNTER — Other Ambulatory Visit: Payer: Self-pay | Admitting: Orthopedic Surgery

## 2015-11-04 ENCOUNTER — Encounter (HOSPITAL_BASED_OUTPATIENT_CLINIC_OR_DEPARTMENT_OTHER)
Admission: RE | Disposition: A | Discharge status: Home / Self Care | Payer: Self-pay | Source: Ambulatory Visit | Attending: Orthopedic Surgery

## 2015-11-04 ENCOUNTER — Encounter (HOSPITAL_BASED_OUTPATIENT_CLINIC_OR_DEPARTMENT_OTHER): Payer: Self-pay

## 2015-11-04 ENCOUNTER — Ambulatory Visit (HOSPITAL_BASED_OUTPATIENT_CLINIC_OR_DEPARTMENT_OTHER): Payer: Self-pay | Admitting: Anesthesiology

## 2015-11-04 ENCOUNTER — Ambulatory Visit (HOSPITAL_BASED_OUTPATIENT_CLINIC_OR_DEPARTMENT_OTHER)
Admission: RE | Admit: 2015-11-04 | Discharge: 2015-11-04 | Disposition: A | Discharge status: Home / Self Care | Payer: Self-pay | Source: Ambulatory Visit | Attending: Orthopedic Surgery | Admitting: Orthopedic Surgery

## 2015-11-04 DIAGNOSIS — E669 Obesity, unspecified: Secondary | ICD-10-CM | POA: Insufficient documentation

## 2015-11-04 DIAGNOSIS — Z6833 Body mass index (BMI) 33.0-33.9, adult: Secondary | ICD-10-CM | POA: Insufficient documentation

## 2015-11-04 DIAGNOSIS — S52502A Unspecified fracture of the lower end of left radius, initial encounter for closed fracture: Secondary | ICD-10-CM | POA: Diagnosis present

## 2015-11-04 DIAGNOSIS — J45909 Unspecified asthma, uncomplicated: Secondary | ICD-10-CM | POA: Insufficient documentation

## 2015-11-04 DIAGNOSIS — F329 Major depressive disorder, single episode, unspecified: Secondary | ICD-10-CM | POA: Insufficient documentation

## 2015-11-04 DIAGNOSIS — S52552A Other extraarticular fracture of lower end of left radius, initial encounter for closed fracture: Secondary | ICD-10-CM | POA: Insufficient documentation

## 2015-11-04 DIAGNOSIS — X58XXXA Exposure to other specified factors, initial encounter: Secondary | ICD-10-CM | POA: Insufficient documentation

## 2015-11-04 HISTORY — DX: Unspecified fracture of the lower end of left radius, initial encounter for closed fracture: S52.502A

## 2015-11-04 HISTORY — PX: OPEN REDUCTION INTERNAL FIXATION (ORIF) DISTAL RADIAL FRACTURE: SHX5989

## 2015-11-04 SURGERY — OPEN REDUCTION INTERNAL FIXATION (ORIF) DISTAL RADIUS FRACTURE
Anesthesia: Regional | Site: Wrist | Laterality: Left

## 2015-11-04 MED ORDER — MIDAZOLAM HCL 2 MG/2ML IJ SOLN
INTRAMUSCULAR | Status: AC
  Filled 2015-11-04: qty 2

## 2015-11-04 MED ORDER — MIDAZOLAM HCL 2 MG/2ML IJ SOLN
1.0000 mg | INTRAMUSCULAR | Status: DC | PRN
  Administered 2015-11-04: 2 mg via INTRAVENOUS

## 2015-11-04 MED ORDER — FENTANYL CITRATE (PF) 100 MCG/2ML IJ SOLN
INTRAMUSCULAR | Status: AC
  Filled 2015-11-04: qty 2

## 2015-11-04 MED ORDER — CEFAZOLIN SODIUM-DEXTROSE 2-4 GM/100ML-% IV SOLN
2.0000 g | INTRAVENOUS | Status: AC
  Administered 2015-11-04: 2 g via INTRAVENOUS

## 2015-11-04 MED ORDER — HYDROMORPHONE HCL 1 MG/ML IJ SOLN: 0.2500 mg | INTRAMUSCULAR | Status: DC | PRN

## 2015-11-04 MED ORDER — EPHEDRINE SULFATE 50 MG/ML IJ SOLN
INTRAMUSCULAR | Status: DC | PRN
  Administered 2015-11-04: 15 mg via INTRAVENOUS
  Administered 2015-11-04: 10 mg via INTRAVENOUS

## 2015-11-04 MED ORDER — LACTATED RINGERS IV SOLN
INTRAVENOUS | Status: DC
  Administered 2015-11-04 (×3): via INTRAVENOUS

## 2015-11-04 MED ORDER — PROPOFOL 10 MG/ML IV BOLUS
INTRAVENOUS | Status: DC | PRN
  Administered 2015-11-04: 150 mg via INTRAVENOUS

## 2015-11-04 MED ORDER — FENTANYL CITRATE (PF) 100 MCG/2ML IJ SOLN
50.0000 ug | INTRAMUSCULAR | Status: DC | PRN
  Administered 2015-11-04: 100 ug via INTRAVENOUS

## 2015-11-04 MED ORDER — DEXAMETHASONE SODIUM PHOSPHATE 10 MG/ML IJ SOLN
INTRAMUSCULAR | Status: DC | PRN
  Administered 2015-11-04: 10 mg via INTRAVENOUS

## 2015-11-04 MED ORDER — CEFAZOLIN SODIUM-DEXTROSE 2-4 GM/100ML-% IV SOLN
INTRAVENOUS | Status: AC
  Filled 2015-11-04: qty 100

## 2015-11-04 MED ORDER — PROMETHAZINE HCL 25 MG/ML IJ SOLN
6.2500 mg | Freq: Once | INTRAMUSCULAR | Status: AC
Start: 2015-11-04 — End: 2015-11-04
  Administered 2015-11-04: 6.25 mg via INTRAVENOUS

## 2015-11-04 MED ORDER — ONDANSETRON HCL 4 MG PO TABS: 4.0000 mg | ORAL_TABLET | Freq: Three times a day (TID) | ORAL | 0 refills | Status: AC | PRN

## 2015-11-04 MED ORDER — ONDANSETRON HCL 4 MG/2ML IJ SOLN
INTRAMUSCULAR | Status: DC | PRN
  Administered 2015-11-04: 4 mg via INTRAVENOUS

## 2015-11-04 MED ORDER — BUPIVACAINE-EPINEPHRINE (PF) 0.5% -1:200000 IJ SOLN
INTRAMUSCULAR | Status: DC | PRN
  Administered 2015-11-04: 30 mL via PERINEURAL

## 2015-11-04 MED ORDER — GLYCOPYRROLATE 0.2 MG/ML IJ SOLN: 0.2000 mg | Freq: Once | INTRAMUSCULAR | Status: DC | PRN

## 2015-11-04 MED ORDER — OXYCODONE-ACETAMINOPHEN 5-325 MG PO TABS: 1.0000 | ORAL_TABLET | Freq: Four times a day (QID) | ORAL | 0 refills | Status: AC | PRN

## 2015-11-04 MED ORDER — PROMETHAZINE HCL 25 MG/ML IJ SOLN
INTRAMUSCULAR | Status: AC
  Filled 2015-11-04: qty 1

## 2015-11-04 MED ORDER — SCOPOLAMINE 1 MG/3DAYS TD PT72: 1.0000 | MEDICATED_PATCH | Freq: Once | TRANSDERMAL | Status: DC | PRN

## 2015-11-04 MED ORDER — SENNA-DOCUSATE SODIUM 8.6-50 MG PO TABS: 2.0000 | ORAL_TABLET | Freq: Every day | ORAL | 1 refills | Status: AC

## 2015-11-04 MED ORDER — LIDOCAINE HCL (CARDIAC) 20 MG/ML IV SOLN
INTRAVENOUS | Status: DC | PRN
  Administered 2015-11-04: 20 mg via INTRAVENOUS

## 2015-11-04 SURGICAL SUPPLY — 69 items
BANDAGE ACE 3X5.8 VEL STRL LF (GAUZE/BANDAGES/DRESSINGS) IMPLANT
BANDAGE ACE 4X5 VEL STRL LF (GAUZE/BANDAGES/DRESSINGS) IMPLANT
BIT DRILL 2.2 SS TIBIAL (BIT) ×2 IMPLANT
BLADE MINI RND TIP GREEN BEAV (BLADE) IMPLANT
BLADE SURG 15 STRL LF DISP TIS (BLADE) ×1 IMPLANT
BLADE SURG 15 STRL SS (BLADE) ×3
BNDG CMPR 9X4 STRL LF SNTH (GAUZE/BANDAGES/DRESSINGS) ×1
BNDG COHESIVE 4X5 TAN STRL (GAUZE/BANDAGES/DRESSINGS) ×3 IMPLANT
BNDG ESMARK 4X9 LF (GAUZE/BANDAGES/DRESSINGS) ×3 IMPLANT
CLOSURE STERI-STRIP 1/2X4 (GAUZE/BANDAGES/DRESSINGS) ×1
CLSR STERI-STRIP ANTIMIC 1/2X4 (GAUZE/BANDAGES/DRESSINGS) ×2 IMPLANT
CORDS BIPOLAR (ELECTRODE) ×3 IMPLANT
COVER BACK TABLE 60X90IN (DRAPES) ×3 IMPLANT
CUFF TOURNIQUET SINGLE 18IN (TOURNIQUET CUFF) ×2 IMPLANT
DECANTER SPIKE VIAL GLASS SM (MISCELLANEOUS) ×3 IMPLANT
DRAPE EXTREMITY T 121X128X90 (DRAPE) ×3 IMPLANT
DRAPE IMP U-DRAPE 54X76 (DRAPES) ×3 IMPLANT
DRAPE OEC MINIVIEW 54X84 (DRAPES) ×3 IMPLANT
DRAPE SURG 17X23 STRL (DRAPES) ×3 IMPLANT
DURAPREP 26ML APPLICATOR (WOUND CARE) ×3 IMPLANT
GAUZE SPONGE 4X4 12PLY STRL (GAUZE/BANDAGES/DRESSINGS) ×3 IMPLANT
GLOVE BIO SURGEON STRL SZ8 (GLOVE) ×3 IMPLANT
GLOVE BIOGEL PI IND STRL 8 (GLOVE) ×2 IMPLANT
GLOVE BIOGEL PI INDICATOR 8 (GLOVE) ×4
GLOVE ORTHO TXT STRL SZ7.5 (GLOVE) ×3 IMPLANT
GOWN STRL REUS W/ TWL LRG LVL3 (GOWN DISPOSABLE) ×1 IMPLANT
GOWN STRL REUS W/ TWL XL LVL3 (GOWN DISPOSABLE) ×2 IMPLANT
GOWN STRL REUS W/TWL LRG LVL3 (GOWN DISPOSABLE) ×3
GOWN STRL REUS W/TWL XL LVL3 (GOWN DISPOSABLE) ×6
K-WIRE 1.6 (WIRE) ×3
K-WIRE FX5X1.6XNS BN SS (WIRE) ×1
KWIRE FX5X1.6XNS BN SS (WIRE) IMPLANT
NDL HYPO 25X1 1.5 SAFETY (NEEDLE) IMPLANT
NEEDLE HYPO 25X1 1.5 SAFETY (NEEDLE) IMPLANT
NS IRRIG 1000ML POUR BTL (IV SOLUTION) ×3 IMPLANT
PACK BASIN DAY SURGERY FS (CUSTOM PROCEDURE TRAY) ×3 IMPLANT
PAD CAST 3X4 CTTN HI CHSV (CAST SUPPLIES) IMPLANT
PAD CAST 4YDX4 CTTN HI CHSV (CAST SUPPLIES) IMPLANT
PADDING CAST ABS 4INX4YD NS (CAST SUPPLIES) ×2
PADDING CAST ABS COTTON 4X4 ST (CAST SUPPLIES) ×1 IMPLANT
PADDING CAST COTTON 3X4 STRL (CAST SUPPLIES)
PADDING CAST COTTON 4X4 STRL (CAST SUPPLIES)
PEG LOCKING SMOOTH 2.2X16 (Screw) ×2 IMPLANT
PEG LOCKING SMOOTH 2.2X18 (Screw) ×6 IMPLANT
PEG LOCKING SMOOTH 2.2X20 (Screw) ×4 IMPLANT
PLATE NARROW DVR LEFT (Plate) ×2 IMPLANT
SCREW LOCK 12X2.7X 3 LD (Screw) IMPLANT
SCREW LOCK 14X2.7X 3 LD TPR (Screw) IMPLANT
SCREW LOCKING 2.7X12MM (Screw) ×3 IMPLANT
SCREW LOCKING 2.7X14 (Screw) ×3 IMPLANT
SCREW LOCKING 2.7X15MM (Screw) ×2 IMPLANT
SLEEVE SCD COMPRESS KNEE MED (MISCELLANEOUS) ×3 IMPLANT
SPLINT PLASTER CAST XFAST 3X15 (CAST SUPPLIES) IMPLANT
SPLINT PLASTER XTRA FASTSET 3X (CAST SUPPLIES)
SUCTION FRAZIER HANDLE 10FR (MISCELLANEOUS) ×2
SUCTION TUBE FRAZIER 10FR DISP (MISCELLANEOUS) ×1 IMPLANT
SUT ETHILON 3 0 PS 1 (SUTURE) IMPLANT
SUT ETHILON 4 0 PS 2 18 (SUTURE) IMPLANT
SUT MNCRL AB 4-0 PS2 18 (SUTURE) IMPLANT
SUT VIC AB 0 CT1 27 (SUTURE)
SUT VIC AB 0 CT1 27XBRD ANBCTR (SUTURE) IMPLANT
SUT VICRYL 3-0 CR8 SH (SUTURE) ×3 IMPLANT
SYR BULB 3OZ (MISCELLANEOUS) ×3 IMPLANT
SYR CONTROL 10ML LL (SYRINGE) IMPLANT
TOWEL OR 17X24 6PK STRL BLUE (TOWEL DISPOSABLE) ×3 IMPLANT
TOWEL OR NON WOVEN STRL DISP B (DISPOSABLE) ×3 IMPLANT
TUBE CONNECTING 20'X1/4 (TUBING) ×1
TUBE CONNECTING 20X1/4 (TUBING) ×2 IMPLANT
UNDERPAD 30X30 (UNDERPADS AND DIAPERS) ×3 IMPLANT

## 2015-11-04 NOTE — Op Note (Signed)
11/04/2015  12:55 PM  PATIENT:  Traci Higgins    PRE-OPERATIVE DIAGNOSIS:  left distal radius fracture  POST-OPERATIVE DIAGNOSIS:  Same  PROCEDURE:  ORIF DISTAL RADIUS FRACTURE, 3 PIECES  SURGEON:  Eulas PostLANDAU,Sayward Horvath P, MD  PHYSICIAN ASSISTANT: Janace LittenBrandon Parry, OPA-C, present and scrubbed throughout the case, critical for completion in a timely fashion, and for retraction, instrumentation, and closure.  ANESTHESIA:   General  PREOPERATIVE INDICATIONS:  Traci Higgins is a  50 y.o. female with a diagnosis of left distal radius fracture who elected for surgical management due to fracture displacement.    The risks benefits and alternatives were discussed with the patient preoperatively including but not limited to the risks of infection, bleeding, nerve injury, cardiopulmonary complications, the need for revision surgery, tendon rupture, hardware prominence, hardware failure, nonunion, malunion, post-traumatic arthritis, regional pain syndrome, among others, and the patient was willing to proceed.  OPERATIVE IMPLANTS: Biomet DVR volar plate with 3 proximal cortical screws and multiple distal interlocking smooth pegs, using the standard narrow plate.   OPERATIVE FINDINGS: Comminution of the distal radius fracture, particularly dorsally.  UNIQUE ASPECTS OF THE CASE:  The distal ulnar peg was directly adjacent to the subarticular surface, but not within the joint, as visualized on live fluoroscopy, multiple views. The distalmost cortical screw was just at the margin of good bone and a large cortical segment, which was displaced. I did not go posteriorly to retrieve or evaluate the segment posteriorly.  OPERATIVE PROCEDURE: The patient was brought to the operating room and placed in the supine position. General anesthesia was administered. IV antibiotics were given. Time out was performed. The upper extremity was prepped and draped in usual sterile fashion. The arm was elevated and exsanguinated  and the tourniquet was inflated at 250mm hg.    Volar approach to the distal radius was carried out, and the flexor carpi radialis was retracted radially. The radial artery was protected throughout the case.  Deep dissection was carried down, and the pronator quadratus was elevated off of the radius. The fracture site was identified and cleaned and reduced anatomically. This keyed into place nicely.   I held this provisionally with a K wire, and C-arm used to confirm alignment.  I had restored height and inclination and then applied a volar plate. A K wire was used to confirm appropriate position of the plate, and once I was satisfied with the overall alignment I was able to secure the plate proximally with a cortical screw.   I then secured the fracture with multiple smooth interlocking pegs distally, and confirmed that none of these were in the joint, and none of these were penetrating the dorsal cortex. I also secured the plate proximally with one more cortical screw. On the ulnar side of the plate distally I used shorter pegs than on the radial side.    The wounds were irrigated copiously, and the subcutaneous tissue closed with 2-0 Vicryl followed by 3-0 subcutaneous Vicryl for the skin and Steri-Strips and sterile gauze and a volar splint. The tourniquet was released. She was awakened and returned back in stable and satisfactory condition. There were no complications and She tolerated the procedure well.

## 2015-11-04 NOTE — Progress Notes (Signed)
Assisted Dr. Edmond Fitzgerald with left, ultrasound guided, supraclavicular block. Side rails up, monitors on throughout procedure. See vital signs in flow sheet. Tolerated Procedure well. 

## 2015-11-04 NOTE — H&P (Signed)
PREOPERATIVE H&P  Chief Complaint: left distal radius fracture  HPI: Traci Higgins is a 50 y.o. female who presents for preoperative history and physical with a diagnosis of left distal radius fracture. Symptoms are rated as moderate to severe, and have been worsening.  This is significantly impairing activities of daily living.  She has elected for surgical management.   We initially saw her in the emergency room and performed a closed reduction to minimize risk for neurologic compromise and optimize soft tissue swelling. Once the soft tissue swelling had subsided we saw her in the office, and I evaluated her, and scheduled her for surgery.  Past Medical History:  Diagnosis Date  . Allergic rhinitis due to pollen   . Chest pain   . Depression   . Dry eye   . IBS (irritable bowel syndrome)   . Obesity   . Osteopenia   . Relapsing polychondritis    History reviewed. No pertinent surgical history. Social History   Social History  . Marital status: Divorced    Spouse name: N/A  . Number of children: N/A  . Years of education: N/A   Social History Main Topics  . Smoking status: Never Smoker  . Smokeless tobacco: Former Neurosurgeon  . Alcohol use No  . Drug use: No  . Sexual activity: Not Asked   Other Topics Concern  . None   Social History Narrative  . None   Family History  Problem Relation Age of Onset  . Heart attack Mother   . Thyroid disease Mother   . Hypertension Mother   . Heart attack Maternal Grandmother   . Cancer Maternal Grandfather    No Known Allergies Prior to Admission medications   Medication Sig Start Date End Date Taking? Authorizing Provider  Adalimumab 40 MG/0.8ML PSKT Inject 40 mg into the skin once a week.    Yes Historical Provider, MD  albuterol (PROVENTIL HFA;VENTOLIN HFA) 108 (90 Base) MCG/ACT inhaler Inhale 2 puffs into the lungs every 6 (six) hours as needed for wheezing or shortness of breath. 09/23/15  Yes Leslye Peer, MD   HYDROcodone-acetaminophen (NORCO/VICODIN) 5-325 MG tablet Take 1 tablet by mouth every 6 (six) hours as needed for severe pain. 10/30/15  Yes Maretta Bees, MD  Methotrexate, PF, 30 MG/0.6ML SOAJ Inject 0.6 mLs into the skin once a week.   Yes Historical Provider, MD  naproxen sodium (ANAPROX) 550 MG tablet Take 500 mg by mouth 2 (two) times daily with a meal.    Yes Historical Provider, MD  omeprazole (PRILOSEC) 20 MG capsule Take 20 mg by mouth daily as needed (acid relux).   Yes Historical Provider, MD  sertraline (ZOLOFT) 50 MG tablet Take 50 mg by mouth daily.   Yes Historical Provider, MD     Positive ROS: All other systems have been reviewed and were otherwise negative with the exception of those mentioned in the HPI and as above.  Physical Exam: General: Alert, no acute distress Cardiovascular: No pedal edema Respiratory: No cyanosis, no use of accessory musculature GI: No organomegaly, abdomen is soft and non-tender Skin: No lesions in the area of chief complaint Neurologic: Sensation intact distally Psychiatric: Patient is competent for consent with normal mood and affect Lymphatic: No axillary or cervical lymphadenopathy  MUSCULOSKELETAL: Left wrist has positive pain diffusely over the distal radius, moderate soft tissue swelling, all fingers flex extend and abduct, sensation intact throughout the hand.  Assessment: left distal radius fracture   Plan: Plan for Procedure(s):  OPEN REDUCTION INTERNAL FIXATION (ORIF) LEFT DISTAL RADIAL FRACTURE  The risks benefits and alternatives were discussed with the patient including but not limited to the risks of nonoperative treatment, versus surgical intervention including infection, bleeding, nerve injury, malunion, nonunion, the need for revision surgery, hardware prominence, hardware failure, the need for hardware removal, blood clots, cardiopulmonary complications, morbidity, mortality, among others, and they were willing to proceed.     Eulas PostLANDAU,[name], MD Cell [phone]   11/04/2015 10:23 AM

## 2015-11-04 NOTE — Anesthesia Procedure Notes (Signed)
Procedure Name: LMA Insertion Date/Time: 11/04/2015 11:35 AM Performed by: Zenia ResidesPAYNE, Patsye Sullivant D Pre-anesthesia Checklist: Patient identified, Emergency Drugs available, Suction available and Patient being monitored Patient Re-evaluated:Patient Re-evaluated prior to inductionOxygen Delivery Method: Circle system utilized Preoxygenation: Pre-oxygenation with 100% oxygen Intubation Type: IV induction Ventilation: Mask ventilation without difficulty LMA: LMA inserted LMA Size: 4.0 Number of attempts: 1 Airway Equipment and Method: Bite block Placement Confirmation: positive ETCO2 Tube secured with: Tape Dental Injury: Teeth and Oropharynx as per pre-operative assessment

## 2015-11-04 NOTE — Anesthesia Procedure Notes (Addendum)
Anesthesia Regional Block:  Supraclavicular block  Pre-Anesthetic Checklist: ,, timeout performed, Correct Patient, Correct Site, Correct Laterality, Correct Procedure, Correct Position, site marked, Risks and benefits discussed, pre-op evaluation,  At surgeon's request and post-op pain management  Laterality: Left  Prep: Maximum Sterile Barrier Precautions used, chloraprep       Needles:  Injection technique: Single-shot  Needle Type: Echogenic Stimulator Needle     Needle Length: 5cm 5 cm Needle Gauge: 22 and 22 G    Additional Needles:  Procedures: ultrasound guided (picture in chart) Supraclavicular block Narrative:  Start time: 11/04/2015 10:23 AM End time: 11/04/2015 10:33 AM Injection made incrementally with aspirations every 5 mL. Anesthesiologist: Gaynelle AduFITZGERALD, Theophile Harvie  Additional Notes: 2% Lidocaine skin wheel.

## 2015-11-04 NOTE — Discharge Instructions (Signed)
Diet: As you were doing prior to hospitalization   Shower:  May shower but keep the wounds dry, use an occlusive plastic wrap, NO SOAKING IN TUB.  If the bandage gets wet, change with a clean dry gauze.  If you have a splint on, leave the splint in place and keep the splint dry with a plastic bag.  Dressing:  You may change your dressing 3-5 days after surgery, unless you have a splint.  If you have a splint, then just leave the splint in place and we will change your bandages during your first follow-up appointment.    If you had hand or foot surgery, we will plan to remove your stitches in about 2 weeks in the office.  For all other surgeries, there are sticky tapes (steri-strips) on your wounds and all the stitches are absorbable.  Leave the steri-strips in place when changing your dressings, they will peel off with time, usually 2-3 weeks.  Activity:  Increase activity slowly as tolerated, but follow the weight bearing instructions below.  The rules on driving is that you can not be taking narcotics while you drive, and you must feel in control of the vehicle.    Weight Bearing:   Do not lift anything with left hand.  .    To prevent constipation: you may use a stool softener such as -  Colace (over the counter) 100 mg by mouth twice a day  Drink plenty of fluids (prune juice may be helpful) and high fiber foods Miralax (over the counter) for constipation as needed.    Itching:  If you experience itching with your medications, try taking only a single pain pill, or even half a pain pill at a time.  You may take up to 10 pain pills per day, and you can also use benadryl over the counter for itching or also to help with sleep.   Precautions:  If you experience chest pain or shortness of breath - call 911 immediately for transfer to the hospital emergency department!!  If you develop a fever greater that 101 F, purulent drainage from wound, increased redness or drainage from wound, or calf  pain -- Call the office at [phone]                                                Follow- Up Appointment:  Please call for an appointment to be seen in 2 weeks [place] - [phone]    Post Anesthesia Home Care Instructions  Activity: Get plenty of rest for the remainder of the day. A responsible adult should stay with you for 24 hours following the procedure.  For the next 24 hours, DO NOT: -Drive a car -Advertising copywriter -Drink alcoholic beverages -Take any medication unless instructed by your physician -Make any legal decisions or sign important papers.  Meals: Start with liquid foods such as gelatin or soup. Progress to regular foods as tolerated. Avoid greasy, spicy, heavy foods. If nausea and/or vomiting occur, drink only clear liquids until the nausea and/or vomiting subsides. Call your physician if vomiting continues.  Special Instructions/Symptoms: Your throat may feel dry or sore from the anesthesia or the breathing tube placed in your throat during surgery. If this causes discomfort, gargle with warm salt water. The discomfort should disappear within 24 hours.  If you had a scopolamine patch placed behind  your ear for the management of post- operative nausea and/or vomiting:  1. The medication in the patch is effective for 72 hours, after which it should be removed.  Wrap patch in a tissue and discard in the trash. Wash hands thoroughly with soap and water. 2. You may remove the patch earlier than 72 hours if you experience unpleasant side effects which may include dry mouth, dizziness or visual disturbances. 3. Avoid touching the patch. Wash your hands with soap and water after contact with the patch.   Regional Anesthesia Blocks  1. Numbness or the inability to move the "blocked" extremity may last from 3-48 hours after placement. The length of time depends on the medication injected and your individual response to the medication. If the numbness is not going away  after 48 hours, call your surgeon.  2. The extremity that is blocked will need to be protected until the numbness is gone and the  Strength has returned. Because you cannot feel it, you will need to take extra care to avoid injury. Because it may be weak, you may have difficulty moving it or using it. You may not know what position it is in without looking at it while the block is in effect.  3. For blocks in the legs and feet, returning to weight bearing and walking needs to be done carefully. You will need to wait until the numbness is entirely gone and the strength has returned. You should be able to move your leg and foot normally before you try and bear weight or walk. You will need someone to be with you when you first try to ensure you do not fall and possibly risk injury.  4. Bruising and tenderness at the needle site are common side effects and will resolve in a few days.  5. Persistent numbness or new problems with movement should be communicated to the surgeon or the [facility] [phone])/ [facility] [phone]).Call your surgeon if you experience:   1.  Fever over 101.0. 2.  Inability to urinate. 3.  Nausea and/or vomiting. 4.  Extreme swelling or bruising at the surgical site. 5.  Continued bleeding from the incision. 6.  Increased pain, redness or drainage from the incision. 7.  Problems related to your pain medication. 8.  Any problems and/or concerns

## 2015-11-04 NOTE — Transfer of Care (Signed)
Immediate Anesthesia Transfer of Care Note  Patient: Traci Higgins  Procedure(s) Performed: Procedure(s): OPEN REDUCTION INTERNAL FIXATION (ORIF) LEFT DISTAL RADIAL FRACTURE (Left)  Patient Location: PACU  Anesthesia Type:GA combined with regional for post-op pain  Level of Consciousness: awake, alert  and oriented  Airway & Oxygen Therapy: Patient Spontanous Breathing and Patient connected to nasal cannula oxygen  Post-op Assessment: Report given to RN and Post -op Vital signs reviewed and stable  Post vital signs: Reviewed and stable  Last Vitals:  Vitals:   11/04/15 1040 11/04/15 1045  BP: 123/63   Pulse: 76 76  Resp: 19 15  Temp:      Last Pain:  Vitals:   11/04/15 0947  TempSrc: Oral  PainSc: 8       Patients Stated Pain Goal: 2 (11/04/15 0947)  Complications: No apparent anesthesia complications

## 2015-11-04 NOTE — Anesthesia Postprocedure Evaluation (Signed)
Anesthesia Post Note  Patient: Traci Higgins  Procedure(s) Performed: Procedure(s) (LRB): OPEN REDUCTION INTERNAL FIXATION (ORIF) LEFT DISTAL RADIAL FRACTURE (Left)  Patient location during evaluation: PACU Anesthesia Type: General and Regional Level of consciousness: awake and alert Pain management: pain level controlled Vital Signs Assessment: post-procedure vital signs reviewed and stable Respiratory status: spontaneous breathing, nonlabored ventilation and respiratory function stable Cardiovascular status: blood pressure returned to baseline and stable Postop Assessment: no signs of nausea or vomiting Anesthetic complications: no    Last Vitals:  Vitals:   11/04/15 1345 11/04/15 1352  BP: 134/74   Pulse: (!) 55 76  Resp: 14 18  Temp:      Last Pain:  Vitals:   11/04/15 1330  TempSrc:   PainSc: 0-No pain                 Mazin Emma,W. EDMOND

## 2015-11-04 NOTE — Anesthesia Preprocedure Evaluation (Addendum)
Anesthesia Evaluation  Patient identified by MRN, date of birth, ID band Patient awake    Reviewed: Allergy & Precautions, H&P , NPO status , Patient's Chart, lab work & pertinent test results  Airway Mallampati: II  TM Distance: >3 FB Neck ROM: Full    Dental no notable dental hx. (+) Teeth Intact, Dental Advisory Given   Pulmonary asthma ,    Pulmonary exam normal breath sounds clear to auscultation       Cardiovascular negative cardio ROS   Rhythm:Regular Rate:Normal     Neuro/Psych Depression negative neurological ROS     GI/Hepatic negative GI ROS, Neg liver ROS,   Endo/Other  negative endocrine ROS  Renal/GU negative Renal ROS  negative genitourinary   Musculoskeletal   Abdominal   Peds  Hematology negative hematology ROS (+)   Anesthesia Other Findings   Reproductive/Obstetrics negative OB ROS                            Anesthesia Physical Anesthesia Plan  ASA: II  Anesthesia Plan: General and Regional   Post-op Pain Management: GA combined w/ Regional for post-op pain   Induction: Intravenous  Airway Management Planned: LMA  Additional Equipment:   Intra-op Plan:   Post-operative Plan: Extubation in OR  Informed Consent: I have reviewed the patients History and Physical, chart, labs and discussed the procedure including the risks, benefits and alternatives for the proposed anesthesia with the patient or authorized representative who has indicated his/her understanding and acceptance.   Dental advisory given  Plan Discussed with: CRNA  Anesthesia Plan Comments:         Anesthesia Quick Evaluation

## 2015-11-05 NOTE — Addendum Note (Signed)
Addendum  created 11/05/15 [id number] by Lance CoonWesley Brittanyann Wittner, CRNA   Charge Capture section accepted

## 2015-11-06 ENCOUNTER — Encounter (HOSPITAL_BASED_OUTPATIENT_CLINIC_OR_DEPARTMENT_OTHER): Payer: Self-pay | Admitting: Orthopedic Surgery

## 2015-12-02 ENCOUNTER — Inpatient Hospital Stay: Admission: RE | Admit: 2015-12-02 | Payer: Medicaid Other | Source: Ambulatory Visit

## 2015-12-23 ENCOUNTER — Ambulatory Visit: Payer: Medicaid Other | Admitting: Emergency Medicine

## 2017-10-28 IMAGING — CR DG WRIST COMPLETE 3+V*L*
4 series · 4 of 4 positions shown · non-contrast
Comparison: None.

CLINICAL DATA: 50-year-old female with fall and deformity of the
distal forearm

EXAM:
LEFT WRIST - COMPLETE 3+ VIEW; LEFT FOREARM - 2 VIEW

[wrist pa]
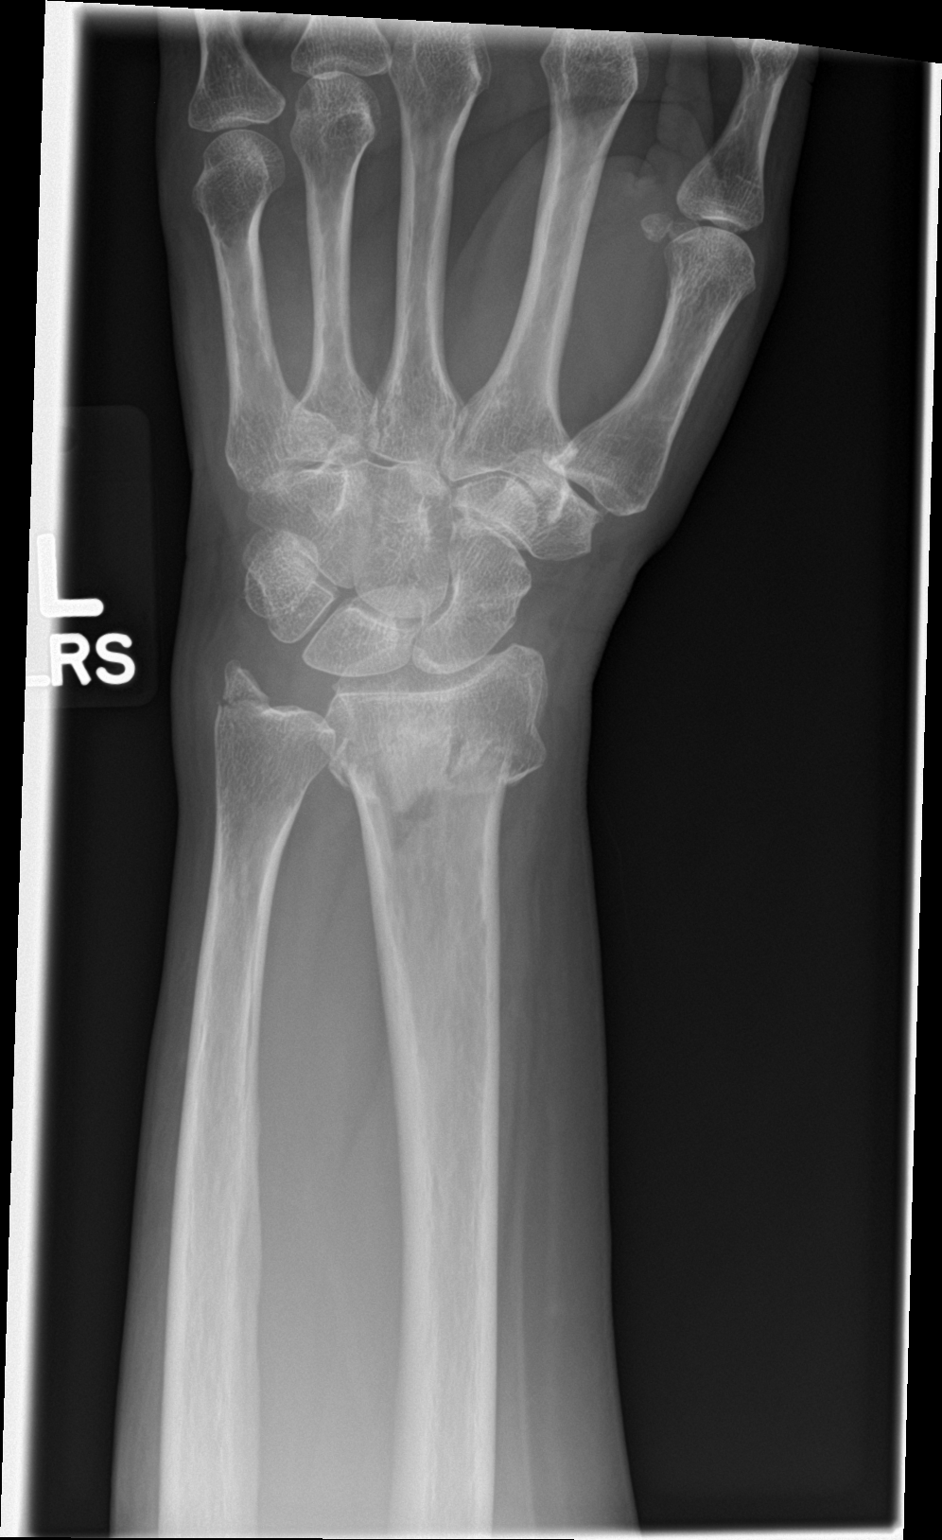

[wrist obl]
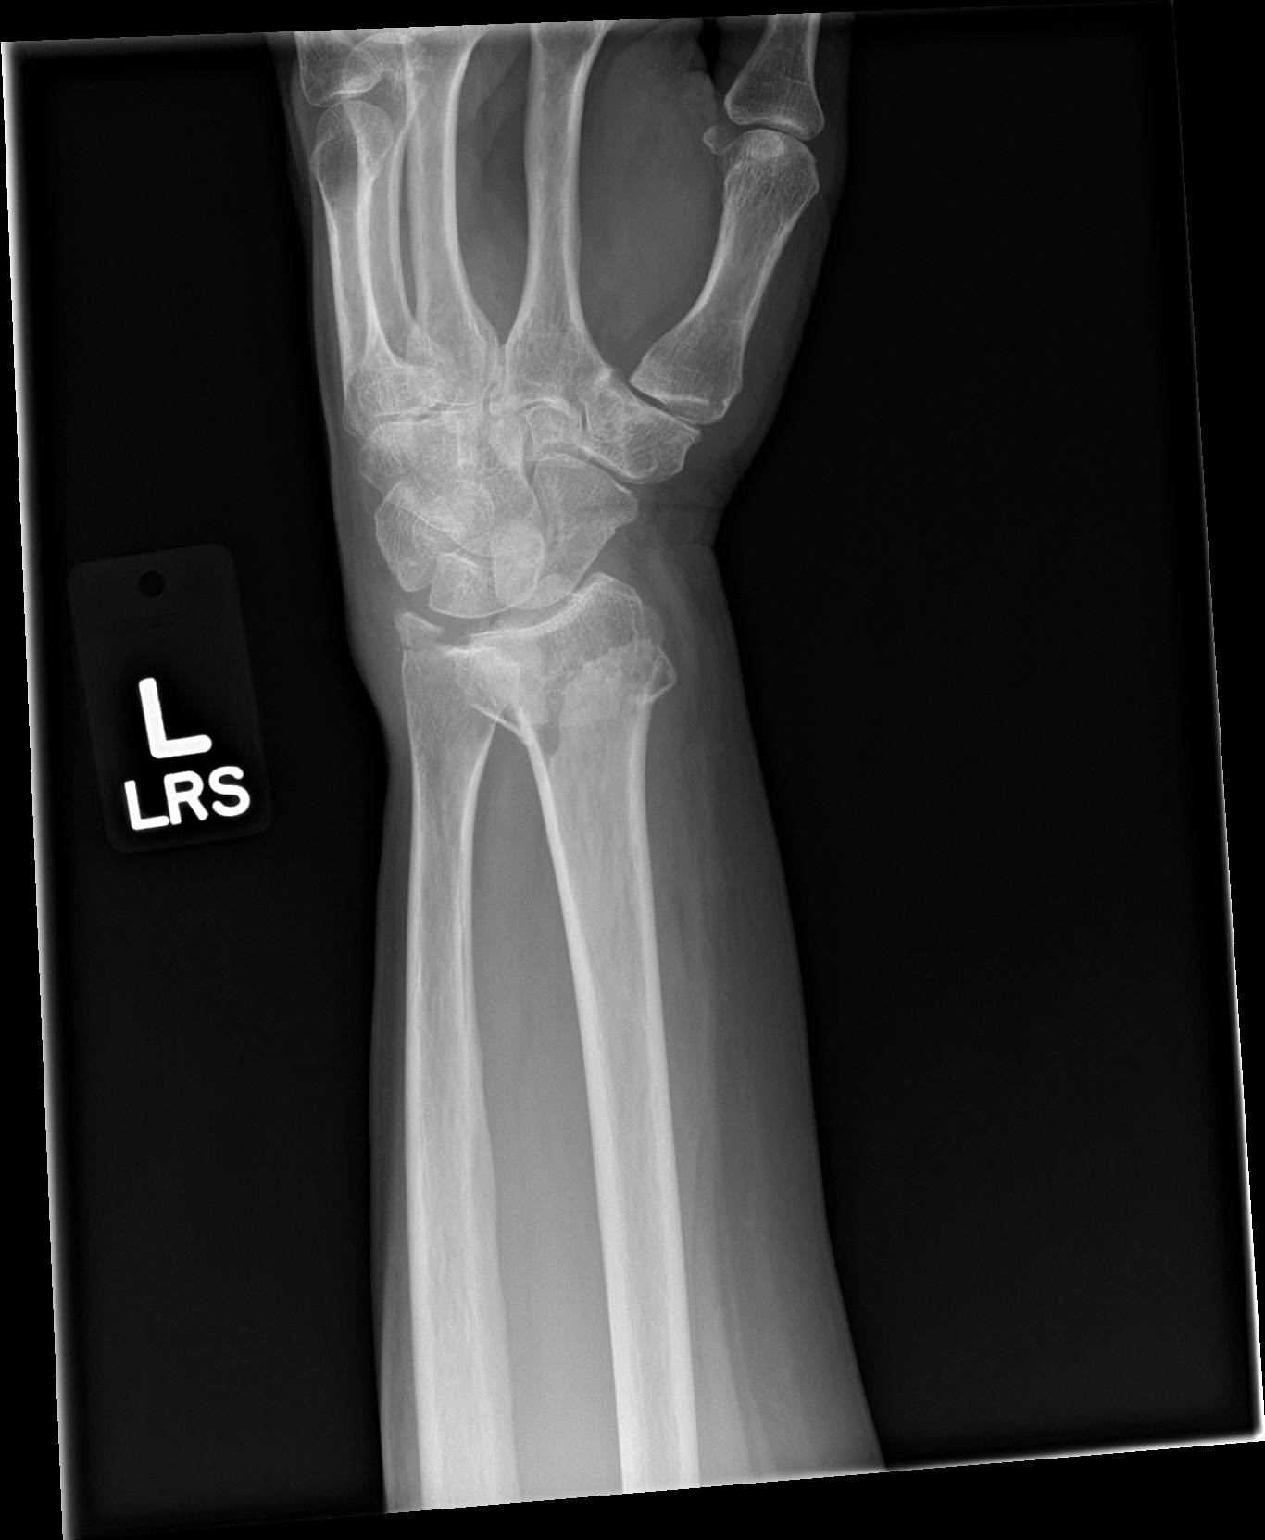

[wrist lat]
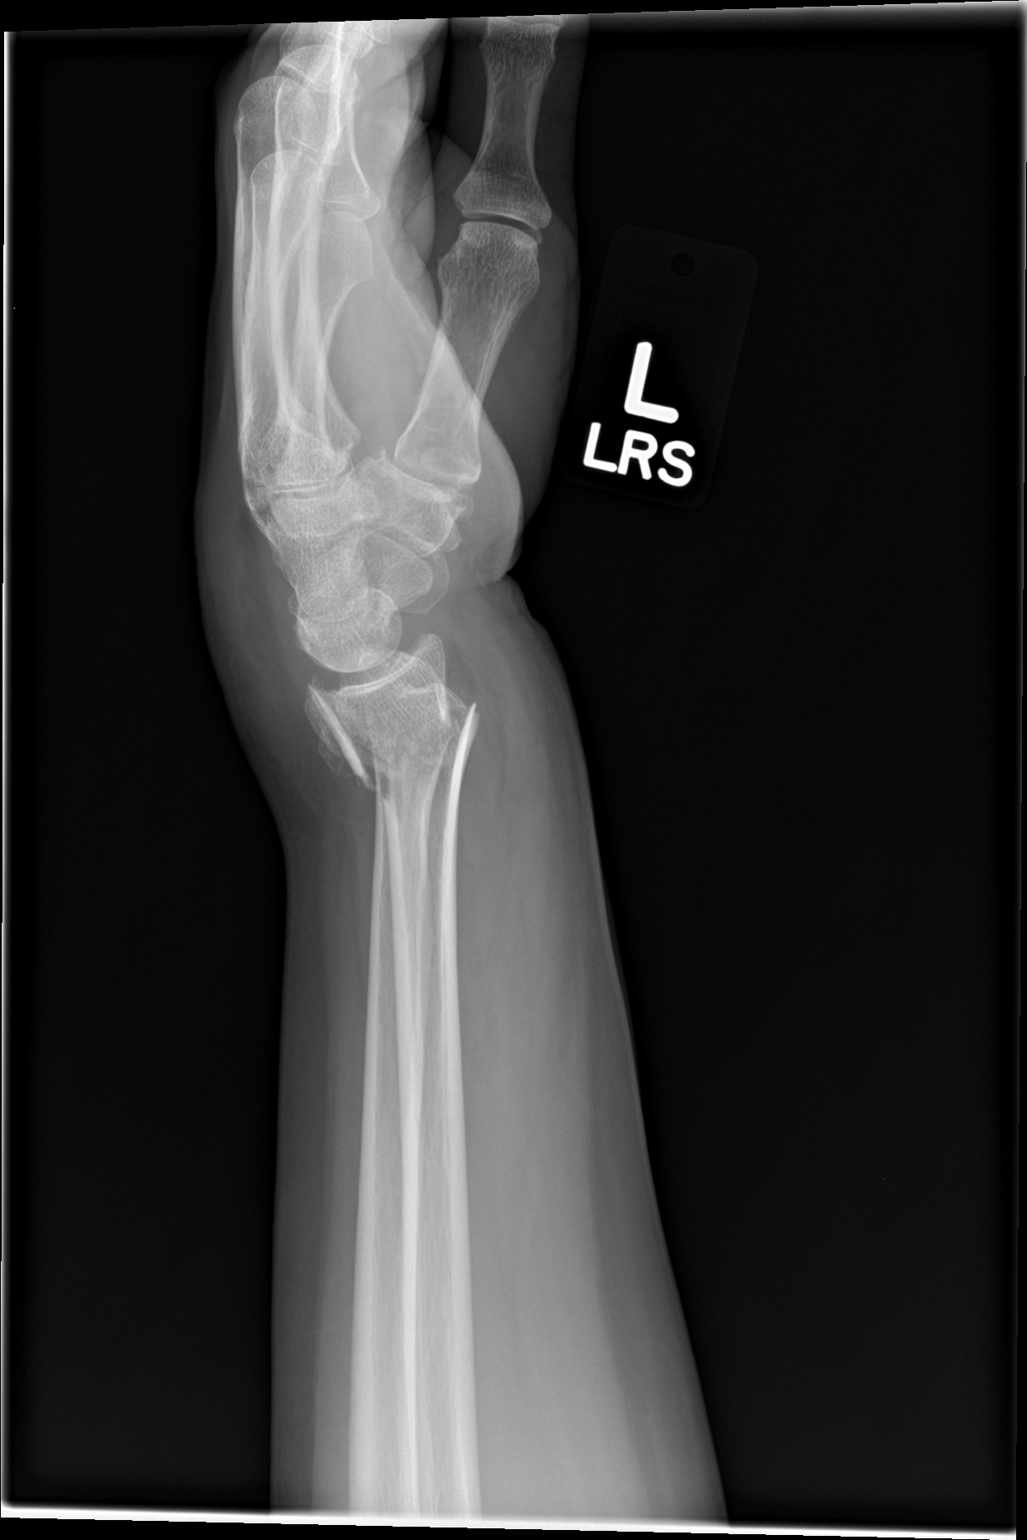

[wrist navicular]
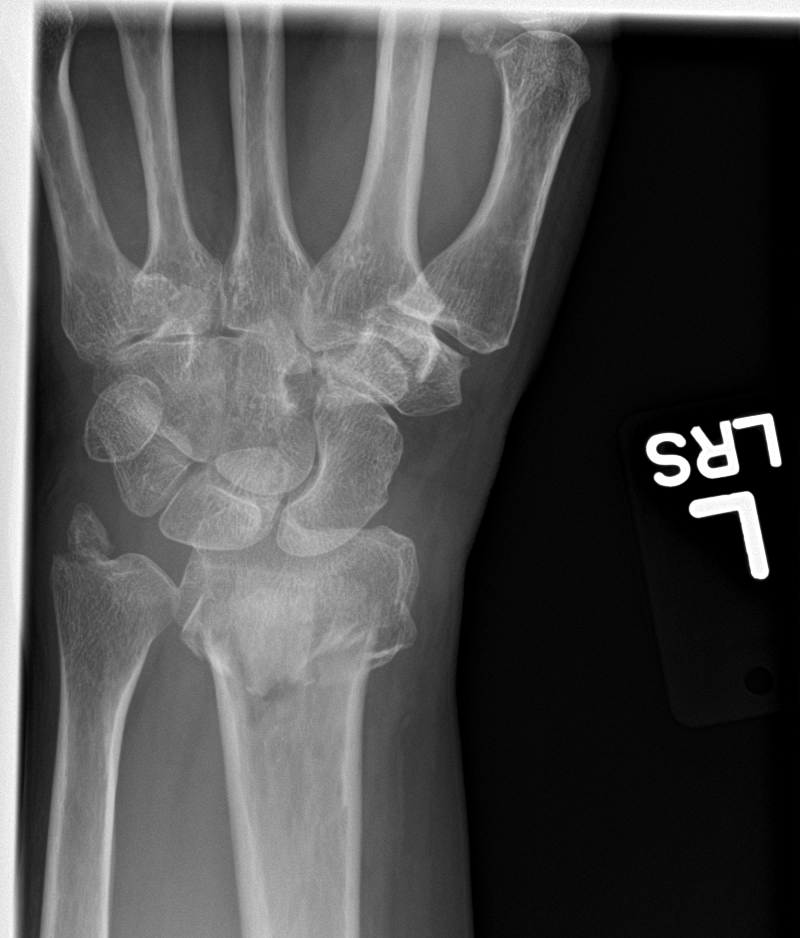

[4 of 4 positions shown; findings below may reference images not displayed]

FINDINGS: There is a comminuted and dorsally angulated fracture of the distal
radius. Evaluation for inter articular extension somewhat limited on
the basis of this radiograph. There is a nondisplaced fracture of
the ulnar-styloid. No other fracture identified. There is no
dislocation. There is diffuse soft tissue swelling of the wrist. No
radiopaque foreign object identified.
IMPRESSION: Comminuted and dorsally angulated fracture of the distal radius as
well as fracture of the ulnar-styloid.
